# Patient Record
Sex: Male | Born: 1965 | Race: White | Hispanic: No | Marital: Single | Smoking: Current every day smoker
Health system: Southern US, Community
[De-identification: ages and names within clinical notes are randomized; demographics above are authoritative.]

## PROBLEM LIST (undated history)

## (undated) DIAGNOSIS — I10 Essential (primary) hypertension: Secondary | ICD-10-CM

## (undated) DIAGNOSIS — K259 Gastric ulcer, unspecified as acute or chronic, without hemorrhage or perforation: Secondary | ICD-10-CM

## (undated) DIAGNOSIS — F1010 Alcohol abuse, uncomplicated: Secondary | ICD-10-CM

## (undated) DIAGNOSIS — K219 Gastro-esophageal reflux disease without esophagitis: Secondary | ICD-10-CM

## (undated) DIAGNOSIS — K60329 Anal fistula, complex, unspecified: Secondary | ICD-10-CM

## (undated) DIAGNOSIS — J449 Chronic obstructive pulmonary disease, unspecified: Secondary | ICD-10-CM

## (undated) HISTORY — DX: Essential (primary) hypertension: I10

## (undated) HISTORY — DX: Anal fistula, complex, unspecified: K60.329

## (undated) HISTORY — DX: Alcohol abuse, uncomplicated: F10.10

## (undated) HISTORY — DX: Chronic obstructive pulmonary disease, unspecified: J44.9

## (undated) HISTORY — PX: FINGER SURGERY: SHX640

---

## 2016-09-03 ENCOUNTER — Ambulatory Visit: Payer: Self-pay | Admitting: Physician Assistant

## 2016-09-03 ENCOUNTER — Ambulatory Visit (HOSPITAL_COMMUNITY)
Admission: RE | Admit: 2016-09-03 | Discharge: 2016-09-03 | Disposition: A | Discharge status: Home / Self Care | Payer: Self-pay | Source: Ambulatory Visit | Attending: Physician Assistant | Admitting: Physician Assistant

## 2016-09-03 ENCOUNTER — Encounter: Payer: Self-pay | Admitting: Physician Assistant

## 2016-09-03 VITALS — BP 120/72 | HR 93 | Temp 98.1°F | Ht 71.5 in | Wt 141.2 lb

## 2016-09-03 DIAGNOSIS — R351 Nocturia: Secondary | ICD-10-CM

## 2016-09-03 DIAGNOSIS — Z1211 Encounter for screening for malignant neoplasm of colon: Secondary | ICD-10-CM

## 2016-09-03 DIAGNOSIS — Z139 Encounter for screening, unspecified: Secondary | ICD-10-CM

## 2016-09-03 DIAGNOSIS — Z131 Encounter for screening for diabetes mellitus: Secondary | ICD-10-CM

## 2016-09-03 DIAGNOSIS — M25551 Pain in right hip: Secondary | ICD-10-CM | POA: Insufficient documentation

## 2016-09-03 DIAGNOSIS — N529 Male erectile dysfunction, unspecified: Secondary | ICD-10-CM

## 2016-09-03 DIAGNOSIS — R202 Paresthesia of skin: Secondary | ICD-10-CM

## 2016-09-03 DIAGNOSIS — R634 Abnormal weight loss: Secondary | ICD-10-CM

## 2016-09-03 DIAGNOSIS — F17219 Nicotine dependence, cigarettes, with unspecified nicotine-induced disorders: Secondary | ICD-10-CM | POA: Insufficient documentation

## 2016-09-03 DIAGNOSIS — Z125 Encounter for screening for malignant neoplasm of prostate: Secondary | ICD-10-CM

## 2016-09-03 LAB — GLUCOSE, POCT (MANUAL RESULT ENTRY): POC GLUCOSE: 107 mg/dL — AB (ref 70–99)

## 2016-09-03 NOTE — Congregational Nurse Program (Signed)
Congregational Nurse Program Note  Date of Encounter: 09/03/2016  Past Medical History: No past medical history on file.  Encounter Details:     CNP Questionnaire - 09/03/16 1103      Patient Demographics   Is this a new or existing patient? New   Patient is considered a/an Not Applicable   Race Caucasian/White     Patient Assistance   Location of Patient Assistance [facility]   Patient's financial/insurance status Low Income;Self-Pay (Uninsured)   Uninsured Patient ([place]s) Yes   Interventions Counseled to make appt. with provider;Assisted patient in making appt.   Patient referred to apply for the following financial assistance [facility]   Food insecurities addressed Not Applicable   Transportation assistance No   Assistance securing medications No   Educational health offerings Acute disease;Chronic disease;Navigating the healthcare system;Hypertension;Medications     Encounter Details   Primary purpose of visit Acute Illness/Condition Visit;Chronic Illness/Condition Visit;Education/Health Concerns;Navigating the Healthcare System   Was an Emergency Department visit averted? Not Applicable  referred to [facility] with appointment today. May still be referred to ER   Does patient have a medical provider? No   Patient referred to Area Agency;Establish PCP;Clinic   Was a mental health screening completed? (GAINS tool) No   Does patient have dental issues? No   Does patient have vision issues? No   Does your patient have an abnormal blood pressure today? Yes   Since previous encounter, have you referred patient for abnormal blood pressure that resulted in a new diagnosis or medication change? No   Does your patient have an abnormal blood glucose today? No   Since previous encounter, have you referred patient for abnormal blood glucose that resulted in a new diagnosis or medication change? No   Was there a life-saving intervention made? No      New Client to Hyman Bower, sent here from the [facility] today for screening for a medical provider, since he was in town. 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 He has no medical insurance.   Chief complaint today is right hip pain that radiates to his right groin and into his right buttock and feels like a popping at times. He reports that about 3 weeks ago, he was stepping off a trailer onto his right leg and felt an immediate pop and pain in his right buttock and right hip and went to the ground in pain. He was finally able to get up on thinking it would get better. He did not seek any treatment at the hospital due to no insurance and financial reasons. He states that his pain is no better. He has pain with lying , sitting , standing and walking , but states it hurts worse when lying in bed. Upon RN entering the room client was sitting in a chair on his left hip with his right leg turned internally to relieve pain. Client is able to sit with hip flexed and legs straight. No further movement of the hip joint or leg due to unknown cause of pain. Client states he has been taking "goody powders" to the point of his stomach hurting along with tylenols and ibuprofen, but only the goody powders help the most. RN discussed with client the issues and potential problems with using goody powders frequently such as gastric issues, esophagus issues due to  the powders. Client states understanding. "I just want to be fixed". Client appears in pain, he is able to walk but with pain and decreased weight bearing on his right leg.  Client reports he has not seen a medical provider in over 10 years due to financial and insurance issues.  Client also reports that he has to get up 7 to 8 times a night with the urge to urinate , but then only urinates a small amount or none. 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 He has not had any labs checked. He also reports sexual dysfunction and states his girlfriend left him 8 years ago due to "things don't work" He has not had any other exams since. He also complains of numbness and  Throbbing pain in both lower extremities from the knees down and tingling.  client relates this has been going on prior to the hip injury. He states he cannot feel his feet at night and uses and heating blanket to keep them warm. Client denies any past history of back injury. He is a smoker. Due to pain with hip movement , client not asked to remove his work boots. ( client referred and has an appointment with medical provider today at 1:45 PM at the [facility])  Alert and oriented to person and place, answers questions appropriately. Appears to be in considerable pain. RN discussed options for medical care including Emergency room. Client is worried about incurring a hospital bill. He wishes for a referral to the [facility]. Will refer for an as soon as possible appointment. Appointment secured for today 09/03/16 at 1:45 PM which is better for client since Mother drove him and she came with elderly grandmother from [place] to pick him up in [place] today to get him medical treatment. They are willing to stay in town for his appointment. RN did explain that the patient could still be referred to the Emergency room for evaluation if the medical provider at the [facility] felt it necessary. RN explained there is financial assistance available to those who qualify that could possibly assist with those bills. Client understands and is willing to proceed. RN also discussed that if future follow appointments for [facility] with [facility] were needed and that he had no transportation to notify RN within at least 3 to 4 business days to arrange [facility]. Client given RN contact information. 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 RN also discussed with client asking if follow ups could possibly be done in [place] when the free clinic is there once a month. Client made aware that would be at the discretion of the provider. Client states verbal understanding.  Will follow up with client after his appointment today and as needed for any further needs.   Vital signs today: Blood pressure 168/79, pulse 105, Temp 98.1 orally  Random blood glucose 107. Resp 13.

## 2016-09-03 NOTE — Progress Notes (Signed)
BP 120/72 (BP Location: Left Arm, Patient Position: Sitting, Cuff Size: Normal)   Pulse 93   Temp 98.1 F (36.7 C)   Ht 5' 11.5" (1.816 m)   Wt 141 lb 4 oz (64.1 kg)   SpO2 99%   BMI 19.43 kg/m    Subjective:    Patient ID: Travis Weeks, male    DOB: January 02, 1966, 51 y.o.   MRN: [id number]  HPI: Travis Weeks is a 51 y.o. male presenting on 09/03/2016 for New Patient (Initial Visit) (pt has not had a PCP in many years.) and Hip Pain (for 3 weeks. pain radiating down to knee. pt has tried muscle rub and has not helped. pt also taking goody powers and IBU. pt states does not help)   HPI  Chief Complaint  Patient presents with  . New Patient (Initial Visit)    pt has not had a PCP in many years.  . Hip Pain    for 3 weeks. pain radiating down to knee. pt has tried muscle rub and has not helped. pt also taking goody powers and IBU. pt states does not help    Pt states when he stepped off a step his hip "just popped out".  Instant pain and it has been constant.  It is making it difficult for him to walk.  Pt states problems urinating for years. He gets up 6-8 times each night to go to the bathroom.   No dysuria.    Pt also complains of ED x 4 years  Pt also says that he has Lost 30 pounds over past few months  Pt states numbness and pain BLE from the knee down for past few years.    Relevant past medical, surgical, family and social history reviewed and updated as indicated. Interim medical history since our last visit reviewed. Allergies and medications reviewed and updated.   Current Outpatient Prescriptions:  .  Aspirin-Acetaminophen-Caffeine (GOODY HEADACHE PO), Take by mouth., Disp: , Rfl:  .  ibuprofen (ADVIL,MOTRIN) 200 MG tablet, Take 200 mg by mouth every 6 (six) hours as needed., Disp: , Rfl:    Review of Systems  Constitutional: Positive for appetite change and fatigue. Negative for chills, diaphoresis, fever and unexpected weight change.  HENT: Positive for dental  problem and trouble swallowing. Negative for congestion, drooling, ear pain, facial swelling, hearing loss, mouth sores, sneezing, sore throat and voice change.   Eyes: Negative for pain, discharge, redness, itching and visual disturbance.  Respiratory: Positive for choking. Negative for cough, shortness of breath and wheezing.   Cardiovascular: Negative for chest pain, palpitations and leg swelling.  Gastrointestinal: Negative for abdominal pain, blood in stool, constipation, diarrhea and vomiting.  Endocrine: Negative for cold intolerance, heat intolerance and polydipsia.  Genitourinary: Negative for decreased urine volume, dysuria and hematuria.  Musculoskeletal: Positive for arthralgias and gait problem. Negative for back pain.  Skin: Negative for rash.  Allergic/Immunologic: Negative for environmental allergies.  Neurological: Negative for seizures, syncope, light-headedness and headaches.  Hematological: Negative for adenopathy.  Psychiatric/Behavioral: Negative for agitation, dysphoric mood and suicidal ideas. The patient is not nervous/anxious.     Per HPI unless specifically indicated above     Objective:    BP 120/72 (BP Location: Left Arm, Patient Position: Sitting, Cuff Size: Normal)   Pulse 93   Temp 98.1 F (36.7 C)   Ht 5' 11.5" (1.816 m)   Wt 141 lb 4 oz (64.1 kg)   SpO2 99%   BMI 19.43 kg/m  Wt Readings from Last 3 Encounters:  09/03/16 141 lb 4 oz (64.1 kg)  09/03/16 139 lb 3.2 oz (63.1 kg)    Physical Exam  Constitutional: He is oriented to person, place, and time. He appears well-developed and well-nourished.  Pleasant gentleman who appears much older than stated age.  HENT:  Head: Normocephalic and atraumatic.  Mouth/Throat: Oropharynx is clear and moist. No oropharyngeal exudate.  Eyes: Conjunctivae and EOM are normal. Pupils are equal, round, and reactive to light.  Neck: Neck supple. No thyromegaly present.  Cardiovascular: Normal rate and regular  rhythm.   Pulses:      Dorsalis pedis pulses are 1+ on the right side, and 1+ on the left side.  Pulmonary/Chest: Effort normal and breath sounds normal. He has no wheezes. He has no rales.  Abdominal: Soft. Bowel sounds are normal. He exhibits no mass. There is no hepatosplenomegaly. There is no tenderness.  Musculoskeletal: He exhibits no edema.       Right hip: He exhibits decreased range of motion and tenderness. He exhibits no bony tenderness, no swelling and no deformity.       Lumbar back: He exhibits tenderness. He exhibits normal range of motion, no bony tenderness, no swelling, no edema and no deformity.       Back:  Lymphadenopathy:    He has no cervical adenopathy.  Neurological: He is alert and oriented to person, place, and time.  Reflex Scores:      Patellar reflexes are 1+ on the right side and 2+ on the left side. Skin: Skin is warm and dry. No rash noted.  Psychiatric: He has a normal mood and affect. His behavior is normal. Thought content normal.  Vitals reviewed.   Results for orders placed or performed in visit on 09/03/16  POCT glucose  Result Value Ref Range   POC Glucose 107 (A) 70 - 99 mg/dl      Assessment & Plan:   Encounter Diagnoses  Name Primary?  . Right hip pain Yes  . Weight loss   . Cigarette nicotine dependence with nicotine-induced disorder   . Nocturia   . Erectile dysfunction, unspecified erectile dysfunction type   . Paresthesias   . Screening for diabetes mellitus   . Screening for prostate cancer   . Screening for colon cancer     -will get baseline labs drawn today.  Pt will need lipids checked but no lipids today because he needs labs but can't get back to town for blood draw in a timely fashion -get Xray of the hip -pt is given ifobt for colon cancer screening -pt is given [facility] discount application -pt is given information on [facility] -follow up1 week.  Pt to RTO sooner prn worsening or new symptoms

## 2016-09-03 NOTE — Patient Instructions (Signed)
Get xray Get bloodwork Turn in [facility] discount application Bring poop test back with you to your follow up appointment

## 2016-09-04 ENCOUNTER — Observation Stay (HOSPITAL_COMMUNITY): Payer: Self-pay

## 2016-09-04 ENCOUNTER — Encounter (HOSPITAL_COMMUNITY): Payer: Self-pay | Admitting: Emergency Medicine

## 2016-09-04 ENCOUNTER — Observation Stay (HOSPITAL_COMMUNITY)
Admission: EM | Admit: 2016-09-04 | Discharge: 2016-09-05 | Disposition: A | Discharge status: Home / Self Care | Payer: Self-pay | Attending: Emergency Medicine | Admitting: Emergency Medicine

## 2016-09-04 ENCOUNTER — Telehealth: Payer: Self-pay

## 2016-09-04 DIAGNOSIS — F17219 Nicotine dependence, cigarettes, with unspecified nicotine-induced disorders: Secondary | ICD-10-CM | POA: Diagnosis present

## 2016-09-04 DIAGNOSIS — F17210 Nicotine dependence, cigarettes, uncomplicated: Secondary | ICD-10-CM | POA: Insufficient documentation

## 2016-09-04 DIAGNOSIS — Z7982 Long term (current) use of aspirin: Secondary | ICD-10-CM | POA: Insufficient documentation

## 2016-09-04 DIAGNOSIS — M25551 Pain in right hip: Secondary | ICD-10-CM | POA: Diagnosis present

## 2016-09-04 DIAGNOSIS — M25559 Pain in unspecified hip: Secondary | ICD-10-CM

## 2016-09-04 DIAGNOSIS — F1010 Alcohol abuse, uncomplicated: Secondary | ICD-10-CM | POA: Diagnosis present

## 2016-09-04 DIAGNOSIS — E871 Hypo-osmolality and hyponatremia: Principal | ICD-10-CM | POA: Diagnosis present

## 2016-09-04 LAB — COMPREHENSIVE METABOLIC PANEL
ALK PHOS: 75 U/L (ref 40–115)
ALK PHOS: 79 U/L (ref 38–126)
ALT: 17 U/L (ref 9–46)
ALT: 32 U/L (ref 17–63)
AST: 27 U/L (ref 10–35)
AST: 48 U/L — AB (ref 15–41)
Albumin: 3.4 g/dL — ABNORMAL LOW (ref 3.6–5.1)
Albumin: 3.6 g/dL (ref 3.5–5.0)
Anion gap: 8 (ref 5–15)
BILIRUBIN TOTAL: 0.7 mg/dL (ref 0.2–1.2)
BUN: 7 mg/dL (ref 7–25)
BUN: 6 mg/dL (ref 6–20)
CALCIUM: 9 mg/dL (ref 8.9–10.3)
CHLORIDE: 88 mmol/L — AB (ref 101–111)
CO2: 25 mmol/L (ref 20–31)
CO2: 26 mmol/L (ref 22–32)
CREATININE: 0.67 mg/dL — AB (ref 0.70–1.33)
CREATININE: 0.64 mg/dL (ref 0.61–1.24)
Calcium: 8.9 mg/dL (ref 8.6–10.3)
Chloride: 88 mmol/L — ABNORMAL LOW (ref 98–110)
GLUCOSE: 73 mg/dL (ref 65–99)
Glucose, Bld: 88 mg/dL (ref 65–99)
Potassium: 4.9 mmol/L (ref 3.5–5.3)
Potassium: 4.5 mmol/L (ref 3.5–5.1)
Sodium: 121 mmol/L — ABNORMAL LOW (ref 135–146)
Sodium: 122 mmol/L — ABNORMAL LOW (ref 135–145)
TOTAL PROTEIN: 6.2 g/dL (ref 6.1–8.1)
Total Bilirubin: 0.3 mg/dL (ref 0.3–1.2)
Total Protein: 7 g/dL (ref 6.5–8.1)

## 2016-09-04 LAB — RAPID URINE DRUG SCREEN, HOSP PERFORMED
Amphetamines: NOT DETECTED
BENZODIAZEPINES: POSITIVE — AB
Barbiturates: NOT DETECTED
Cocaine: POSITIVE — AB
Opiates: NOT DETECTED
Tetrahydrocannabinol: NOT DETECTED

## 2016-09-04 LAB — CBC WITH DIFFERENTIAL/PLATELET
BASOS ABS: 0 {cells}/uL (ref 0–200)
BASOS PCT: 0 %
Basophils Absolute: 0 10*3/uL (ref 0.0–0.1)
Basophils Relative: 0 %
EOS ABS: 186 {cells}/uL (ref 15–500)
EOS ABS: 0.2 10*3/uL (ref 0.0–0.7)
EOS PCT: 2 %
Eosinophils Relative: 2 %
HCT: 43.2 % (ref 39.0–52.0)
HEMATOCRIT: 42.1 % (ref 38.5–50.0)
Hemoglobin: 14.2 g/dL (ref 13.2–17.1)
Hemoglobin: 15.6 g/dL (ref 13.0–17.0)
LYMPHS ABS: 2.3 10*3/uL (ref 0.7–4.0)
LYMPHS PCT: 25 %
Lymphocytes Relative: 26 %
Lymphs Abs: 2325 cells/uL (ref 850–3900)
MCH: 32.9 pg (ref 27.0–33.0)
MCH: 33.9 pg (ref 26.0–34.0)
MCHC: 33.7 g/dL (ref 32.0–36.0)
MCHC: 36.1 g/dL — AB (ref 30.0–36.0)
MCV: 97.7 fL (ref 80.0–100.0)
MCV: 93.9 fL (ref 78.0–100.0)
MONO ABS: 651 {cells}/uL (ref 200–950)
MPV: 10 fL (ref 7.5–12.5)
Monocytes Absolute: 0.7 10*3/uL (ref 0.1–1.0)
Monocytes Relative: 7 %
Monocytes Relative: 8 %
NEUTROS PCT: 66 %
Neutro Abs: 6138 cells/uL (ref 1500–7800)
Neutro Abs: 5.6 10*3/uL (ref 1.7–7.7)
Neutrophils Relative %: 64 %
PLATELETS: 275 10*3/uL (ref 150–400)
Platelets: 296 10*3/uL (ref 140–400)
RBC: 4.31 MIL/uL (ref 4.20–5.80)
RBC: 4.6 MIL/uL (ref 4.22–5.81)
RDW: 12.8 % (ref 11.0–15.0)
RDW: 11.8 % (ref 11.5–15.5)
WBC: 9.3 10*3/uL (ref 3.8–10.8)
WBC: 8.8 10*3/uL (ref 4.0–10.5)

## 2016-09-04 LAB — BASIC METABOLIC PANEL
Anion gap: 7 (ref 5–15)
BUN: 5 mg/dL — AB (ref 6–20)
CALCIUM: 8.4 mg/dL — AB (ref 8.9–10.3)
CO2: 24 mmol/L (ref 22–32)
Chloride: 92 mmol/L — ABNORMAL LOW (ref 101–111)
Creatinine, Ser: 0.53 mg/dL — ABNORMAL LOW (ref 0.61–1.24)
GFR calc Af Amer: >60 mL/min (ref >60)
Glucose, Bld: 98 mg/dL (ref 65–99)
POTASSIUM: 4.1 mmol/L (ref 3.5–5.1)
Sodium: 123 mmol/L — ABNORMAL LOW (ref 135–145)

## 2016-09-04 LAB — PSA: PSA: 1.2 ng/mL (ref <=4.0)

## 2016-09-04 LAB — URINALYSIS, ROUTINE W REFLEX MICROSCOPIC
BILIRUBIN URINE: NEGATIVE
GLUCOSE, UA: NEGATIVE mg/dL
HGB URINE DIPSTICK: NEGATIVE
Ketones, ur: NEGATIVE mg/dL
Leukocytes, UA: NEGATIVE
Nitrite: NEGATIVE
PH: 5 (ref 5.0–8.0)
Protein, ur: NEGATIVE mg/dL
Specific Gravity, Urine: 1.004 — ABNORMAL LOW (ref 1.005–1.030)

## 2016-09-04 LAB — TSH
TSH: 1.76 mIU/L (ref 0.40–4.50)
TSH: 1.795 u[IU]/mL (ref 0.350–4.500)

## 2016-09-04 LAB — OSMOLALITY: OSMOLALITY: 270 mosm/kg — AB (ref 275–295)

## 2016-09-04 LAB — HEMOGLOBIN A1C
Hgb A1c MFr Bld: 4.2 % (ref <5.7)
Mean Plasma Glucose: 74 mg/dL

## 2016-09-04 LAB — SODIUM, URINE, RANDOM: Sodium, Ur: <10 mmol/L

## 2016-09-04 MED ORDER — ONDANSETRON HCL 4 MG PO TABS: 4.0000 mg | ORAL_TABLET | Freq: Four times a day (QID) | ORAL | Status: DC | PRN

## 2016-09-04 MED ORDER — NICOTINE 21 MG/24HR TD PT24: 21.0000 mg | MEDICATED_PATCH | Freq: Every day | TRANSDERMAL | Status: DC

## 2016-09-04 MED ORDER — ONDANSETRON HCL 4 MG/2ML IJ SOLN: 4.0000 mg | Freq: Four times a day (QID) | INTRAMUSCULAR | Status: DC | PRN

## 2016-09-04 MED ORDER — MORPHINE SULFATE (PF) 2 MG/ML IV SOLN
2.0000 mg | INTRAVENOUS | Status: DC | PRN
  Administered 2016-09-04 – 2016-09-05 (×4): 2 mg via INTRAVENOUS
  Filled 2016-09-04 (×4): qty 1

## 2016-09-04 MED ORDER — ENOXAPARIN SODIUM 40 MG/0.4ML ~~LOC~~ SOLN: 40.0000 mg | SUBCUTANEOUS | Status: DC

## 2016-09-04 MED ORDER — ACETAMINOPHEN 650 MG RE SUPP: 650.0000 mg | Freq: Four times a day (QID) | RECTAL | Status: DC | PRN

## 2016-09-04 MED ORDER — ACETAMINOPHEN 325 MG PO TABS: 650.0000 mg | ORAL_TABLET | Freq: Four times a day (QID) | ORAL | Status: DC | PRN

## 2016-09-04 NOTE — ED Provider Notes (Signed)
[facility]-EMERGENCY DEPT Provider Note   CSN: [id number] Arrival date & time: 09/04/16  1458     History   Chief Complaint Chief Complaint  Patient presents with  . Abnormal Lab    HPI Travis Weeks is a 51 y.o. male.  The history is provided by the patient.  51 year old male who presents with abnormal lab. States that he had first routine PCP appointment yesterday. Routine blood work is performed. He was notified today that his sodium was low, and was directed to the emergency department for management. States that he has had generalized weakness and fatigue that has been ongoing for 3-4 weeks. States that he may have had decreased appetite and decreased by mouth intake in the setting of intermittent upper abdominal pain. States that he thinks he has an ulcer can see takes a lot of Goody powders for right hip pain that he has been having. No fevers, chills, nausea or vomiting, diarrhea, melena or hematochezia, lower extremity edema, shortness of breath, altered mental status. Does drink alcohol about 3-4 cans of beer daily.  History reviewed. No pertinent past medical history.  Patient Active Problem List   Diagnosis Date Noted  . Cigarette nicotine dependence with nicotine-induced disorder 09/03/2016  . Right hip pain 09/03/2016  . Weight loss 09/03/2016  . Nocturia 09/03/2016    Past Surgical History:  Procedure Laterality Date  . FINGER SURGERY Left    middle finger of L hand. pt unable to move it       Home Medications    Prior to Admission medications   Medication Sig Start Date End Date Taking? Authorizing Provider  Aspirin-Acetaminophen-Caffeine (GOODY HEADACHE PO) Take by mouth.   Yes Historical Provider, MD  ibuprofen (ADVIL,MOTRIN) 200 MG tablet Take 200 mg by mouth every 6 (six) hours as needed.   Yes Historical Provider, MD    Family History Family History  Problem Relation Age of Onset  . Hypertension Mother   . Diabetes Mother     Social History Social  History  Substance Use Topics  . Smoking status: Current Every Day Smoker    Packs/day: 1.00    Years: 39.00    Types: Cigarettes  . Smokeless tobacco: Never Used  . Alcohol use 1.2 oz/week    2 Cans of beer per week     Allergies   Patient has no known allergies.   Review of Systems Review of Systems  Constitutional: Negative for fever.  Respiratory: Negative for cough and shortness of breath.   Cardiovascular: Negative for chest pain.  Gastrointestinal: Positive for abdominal pain. Negative for blood in stool.  Genitourinary: Positive for frequency. Negative for difficulty urinating.  Allergic/Immunologic: Negative for immunocompromised state.  Neurological: Negative for syncope.  Psychiatric/Behavioral: Negative for confusion.  All other systems reviewed and are negative.    Physical Exam Updated Vital Signs BP 119/83   Pulse 88   Temp 98 F (36.7 C) (Oral)   Resp 18   Ht  (1.803 m)   Wt 141 lb (64 kg)   SpO2 100%   BMI 19.67 kg/m   Physical Exam Physical Exam  Nursing note and vitals reviewed. Constitutional: Malnourished appearing, non-toxic, and in no acute distress Head: Normocephalic and atraumatic.  Mouth/Throat: Oropharynx is clear and moist.  Neck: Normal range of motion. Neck supple.  Cardiovascular: Normal rate and regular rhythm.   Pulmonary/Chest: Effort normal and breath sounds normal.  Abdominal: Soft. There is no tenderness. There is no rebound and no guarding.  Musculoskeletal: Normal range of motion. no edema. Neurological: Alert, no facial droop, fluent speech, moves all extremities symmetrically Skin: Skin is warm and dry.  Psychiatric: Cooperative   ED Treatments / Results  Labs (all labs ordered are listed, but only abnormal results are displayed) Labs Reviewed  COMPREHENSIVE METABOLIC PANEL - Abnormal; Notable for the following:       Result Value   Sodium 122 (*)    Chloride 88 (*)    AST 48 (*)    All other  components within normal limits  CBC WITH DIFFERENTIAL/PLATELET - Abnormal; Notable for the following:    MCHC 36.1 (*)    All other components within normal limits  OSMOLALITY  TSH  SODIUM, URINE, RANDOM  OSMOLALITY, URINE  URINALYSIS, ROUTINE W REFLEX MICROSCOPIC    EKG  EKG Interpretation None       Radiology Dg Hip Unilat With Pelvis 2-3 Views Right  Result Date: 09/03/2016 CLINICAL DATA:  Constant right hip pain since climbing off a trailer 3-4 weeks ago. EXAM: DG HIP (WITH OR WITHOUT PELVIS) 2-3V RIGHT COMPARISON:  None. FINDINGS: No fracture.  No bone lesion. The hip joints are normally spaced and aligned. No significant arthropathic change. The SI joints and symphysis pubis are normally spaced and aligned. There are scattered ileo femoral vascular calcifications. The soft tissues are otherwise unremarkable. IMPRESSION: 1. No fracture or significant hip joint abnormality. Consider follow-up right hip MRI or MR arthrogram to assess for internal derangement, such as a labral tear. Electronically Signed   By: Amie Portland M.D.   On: 09/03/2016 15:25    Procedures Procedures (including critical care time) CRITICAL CARE Performed by: Lavera Guise   Total critical care time: 31 minutes  Critical care time was exclusive of separately billable procedures and treating other patients.  Critical care was necessary to treat or prevent imminent or life-threatening deterioration.  Critical care was time spent personally by me on the following activities: development of treatment plan with patient and/or surrogate as well as nursing, examination of patient, obtaining history from patient or surrogate, ordering and performing treatments and interventions, ordering and review of laboratory studies, ordering and review of radiographic studies, pulse oximetry and re-evaluation of patient's condition.  Medications Ordered in ED Medications - No data to display   Initial Impression /  Assessment and Plan / ED Course  I have reviewed the triage vital signs and the nursing notes.  Pertinent labs & imaging results that were available during my care of the patient were reviewed by me and considered in my medical decision making (see chart for details).     With hyponatremia 122 today. 121 yesterday. Mentating normally. Other than generalized weakness, no significant symptoms. Appears euvolemic on exam, but malnourished appearing and does endorse recent history of decreased PO intake and can be hypovolemic hyponatremia. Normal renal function. Does drink daily, but only 3-4 drinks by report. Beer potomania is possible.  Urine sodium and osmol sent. Discussed with Dr. Ophelia Charter. Will plan to admit for work-up  Final Clinical Impressions(s) / ED Diagnoses   Final diagnoses:  Hyponatremia    New Prescriptions New Prescriptions   No medications on file     Lavera Guise, MD 09/04/16 1851

## 2016-09-04 NOTE — Telephone Encounter (Signed)
Called client to follow up post PCP visit on 09/03/16 at the [facility]. Client states all went well, he had an x-ray of his right hip and some blood work drawn and is to go back on 09/11/16. RN inquired if client needed [facility] for transportation and he states his mother is trying to arrange to bring him, however, he will let me know Monday Morning early regarding transportation . Will continue to follow as needed.

## 2016-09-04 NOTE — H&P (Signed)
History and Physical    Travis Weeks ZOX:[id number] DOB: [birth year] DOA: 09/04/2016  PCP: Jacquelin Hawking, PA-C- [facility] the [facility], prior doctor was 15 years ago  Patient coming from: home - lives alone; [place]: mother, Britta Mccreedy, [phone]  Chief Complaint: abnormal labs  HPI: Travis Weeks is a 51 y.o. male with no significant past medical history who was sent to the ER following abnormal lab results at his first physician appointment in 15 years.  He went to the doctor about his right hip, which he hurt when he stepped off a curb wrong and felt a snapping and popping.  Hip feels like it is popped out of joint x 4 weeks.  It hurts terribly.  He has been taking large quantities of Goody powders for this and thinks he may have an ulcer due to burning epigastric pain.  He has no feeling in feet/legs - when PA saw this, she ordered blood tests and he was found to have "low sodium, WBC abnormal, sugar low, high cholesterol...".  Today, he was at home and PA called him and told him to come to the ER right now.  Wakes up 10 times a night going to pee.  Erectile Dysfunction - lost girlfriend 4 years ago because of this.  No chest pain.  No problems with breathing.  +weak/tired x 4 weeks.  Only dizzy when "sugar drops like crazy".  No medication for this problem - "I just swallow a candy bar and a soda and hope for the best."  Needs 2 teeth pulled for the last 6 months, lots of infections.  Weight loss ?30 pounds over several months.  ED Course: Na++ 121 yesterday, 122 today.  Mentating normally.  Euvolemic but malnourished - so possibly hypovolemic hyponatremia.  Beer potomania is also possible.  Urine sodium and osmol sent.  Review of Systems: As per HPI; otherwise review of systems reviewed and negative.   Ambulatory Status:  ambulates without assistance  History reviewed. No pertinent past medical history.  Past Surgical History:  Procedure Laterality Date  . FINGER  SURGERY Left    middle finger of L hand. pt unable to move it    Social History   Social History  . Marital status: Unknown    Spouse name: N/A  . Number of children: N/A  . Years of education: N/A   Occupational History  . metal worker    Social History Main Topics  . Smoking status: Current Every Day Smoker    Packs/day: 1.00    Years: 39.00    Types: Cigarettes  . Smokeless tobacco: Never Used  . Alcohol use 1.2 - 1.8 oz/week    2 - 3 Cans of beer per week  . Drug use: No  . Sexual activity: Not Currently   Other Topics Concern  . Not on file   Social History Narrative  . No narrative on file    No Known Allergies  Family History  Problem Relation Age of Onset  . Hypertension Mother   . Diabetes Mother     Prior to Admission medications   Medication Sig Start Date End Date Taking? Authorizing Provider  Aspirin-Acetaminophen-Caffeine (GOODY HEADACHE PO) Take by mouth.   Yes Historical Provider, MD  ibuprofen (ADVIL,MOTRIN) 200 MG tablet Take 200 mg by mouth every 6 (six) hours as needed.   Yes Historical Provider, MD    Physical Exam: Vitals:   09/04/16 1504 09/04/16 1741 09/04/16 1935  BP: 140/86 119/83 Marland Kitchen)  144/105  Pulse: (!) 113 88 84  Resp: Temp: 98 F (36.7 C)    TempSrc: Oral    SpO2: 100% 100% 99%  Weight: 64 kg (141 lb)    Height:  (1.803 m)       General: Appears calm and comfortable and is NAD; cachectic, appears far older than stated age Eyes:  PERRL, EOMI, normal lids, iris ENT:  grossly normal hearing, lips & tongue, mmm Neck:  no LAD, masses or thyromegaly Cardiovascular:  RRR, no m/r/g. No LE edema.  Respiratory:  CTA bilaterally, no w/r/r. Normal respiratory effort. Abdomen:  soft, ntnd, NABS Skin:  no rash or induration seen on limited exam Musculoskeletal: severe TTP of right hip along flexor insertion with radiation into right gluteal region Psychiatric:  grossly normal mood and affect, speech fluent and  appropriate, AOx3 Neurologic:  CN 2-12 grossly intact, moves all extremities in coordinated fashion other than limitations of pain in right hip  Labs on Admission: I have personally reviewed following labs and imaging studies  CBC:  Recent Labs Lab 09/03/16 1531 09/04/16 1513  WBC 9.3 8.8  NEUTROABS 6,138 5.6  HGB 14.2 15.6  HCT 42.1 43.2  MCV 97.7 93.9  PLT 296 275   Basic Metabolic Panel:  Recent Labs Lab 09/03/16 1531 09/04/16 1513  NA 121* 122*  K 4.9 4.5  CL 88* 88*  CO2 25 26  GLUCOSE 73 88  BUN 7 6  CREATININE 0.67* 0.64  CALCIUM 8.9 9.0   GFR: Estimated Creatinine Clearance: 98.9 mL/min (by C-G formula based on SCr of 0.64 mg/dL). Liver Function Tests:  Recent Labs Lab 09/03/16 1531 09/04/16 1513  AST 27 48*  ALT 17 32  ALKPHOS 75 79  BILITOT 0.7 0.3  PROT 6.2 7.0  ALBUMIN 3.4* 3.6   No results for input(s): LIPASE, AMYLASE in the last 168 hours. No results for input(s): AMMONIA in the last 168 hours. Coagulation Profile: No results for input(s): INR, PROTIME in the last 168 hours. Cardiac Enzymes: No results for input(s): CKTOTAL, CKMB, CKMBINDEX, TROPONINI in the last 168 hours. BNP (last 3 results) No results for input(s): PROBNP in the last 8760 hours. HbA1C:  Recent Labs  09/03/16 1531  HGBA1C 4.2   CBG: No results for input(s): GLUCAP in the last 168 hours. Lipid Profile: No results for input(s): CHOL, HDL, LDLCALC, TRIG, CHOLHDL, LDLDIRECT in the last 72 hours. Thyroid Function Tests:  Recent Labs  09/04/16 1513  TSH 1.795   Anemia Panel: No results for input(s): VITAMINB12, FOLATE, FERRITIN, TIBC, IRON, RETICCTPCT in the last 72 hours. Urine analysis: No results found for: COLORURINE, APPEARANCEUR, LABSPEC, PHURINE, GLUCOSEU, HGBUR, BILIRUBINUR, KETONESUR, PROTEINUR, UROBILINOGEN, NITRITE, LEUKOCYTESUR  Creatinine Clearance: Estimated Creatinine Clearance: 98.9 mL/min (by C-G formula based on SCr of 0.64 mg/dL).  Sepsis  Labs: (procalcitonin:4,lacticidven:4) )No results found for this or any previous visit (from the past 240 hour(s)).   Radiological Exams on Admission: Dg Hip Unilat With Pelvis 2-3 Views Right  Result Date: 09/03/2016 CLINICAL DATA:  Constant right hip pain since climbing off a trailer 3-4 weeks ago. EXAM: DG HIP (WITH OR WITHOUT PELVIS) 2-3V RIGHT COMPARISON:  None. FINDINGS: No fracture.  No bone lesion. The hip joints are normally spaced and aligned. No significant arthropathic change. The SI joints and symphysis pubis are normally spaced and aligned. There are scattered ileo femoral vascular calcifications. The soft tissues are otherwise unremarkable. IMPRESSION: 1. No fracture or significant hip joint abnormality.  Consider follow-up right hip MRI or MR arthrogram to assess for internal derangement, such as a labral tear. Electronically Signed   By: Amie Portland M.D.   On: 09/03/2016 15:25    EKG: not done  Assessment/Plan Principal Problem:   Hyponatremia Active Problems:   Cigarette nicotine dependence with nicotine-induced disorder   Right hip pain   Pertinent labs:  Na 121 yesterday, 122 today (no priors) A1c 4.2 yesterday TSH normal yesterday and today PSA normal yesterday  Hyponatremia -Etiology appears to be euvolemic hyponatremia; there is no physical examination evidence of hypovolemia or hypervolemia at this time -His lack of mental status changes or more serious symptoms indicates that this is likely chronic in nature. -SIADH is a consideration and may indicate need for pulmonary CT or evaluation for CNS disorders -He denies taking medications other than Motrin and Goody powders, but NSAIDs can cause hyponatremia by an uncertain mechanism of action -Will check UDS -Beer potomania is also a serious consideration based on low intake of solutes and electrolytes with relatively high fluid intake. -Will check urinary and serum Osm as well as urinary sodium and  potassium levels -Will treat with fluid restriction of 1L day -This should be adjusted based on UOP with goal fluid intake of 500 cc less per day than his total urinary output -Will hold NSAIDs and ETOH -Will monitor sodium q8h to attempt to avoid overcorrection (>65mEq/L/day) so as to avoid central pontine myelinolysis -If fluid restriction is unsuccessful, would need to consider vaptan therapy   R hip pain -X-ray was negative -This was the primary reason that the patient presented to the doctor -He reports excruciating pain and really wants to know why this is hurting so -Will order MRI  Tobacco dependence -Encourage cessation.  This was discussed with the patient and should be reviewed on an ongoing basis.   Patch ordered.  Alcohol abuse -Patient does acknowledge daily drinking, 2-4 beers -He denies h/o withdrawal symptoms including DTs/seizures -He does not believe he has a problem with alcohol and thinks that he could stop drinking at any time -Will not currently place on CIWA but if there are any concerns will need to add this  Other From a health maintenance standpoint, the patient needs: -Colon cancer screening - given stool cards -Will check fasting lipids in AM -Likely needs TDaP -Lung cancer screening at age 34    DVT prophylaxis: Lovenox Code Status:  DNR - confirmed with patient Family Communication: None present Disposition Plan:  Home once clinically improved Consults called: None  Admission status: It is my clinical opinion that referral for OBSERVATION is reasonable and necessary in this patient based on the above information provided. The aforementioned taken together are felt to place the patient at high risk for further clinical deterioration. However it is anticipated that the patient may be medically stable for discharge from the hospital within 24 to 48 hours.    Jonah Blue MD [facility]  If 7PM-7AM, please contact  night-coverage [email or web address] Password [facility]  09/04/2016, 7:47 PM

## 2016-09-04 NOTE — ED Triage Notes (Signed)
Pt reports going to the doctor for the first time yesterday and was called and told to come to ED for hyponatremia.

## 2016-09-05 DIAGNOSIS — F1010 Alcohol abuse, uncomplicated: Secondary | ICD-10-CM

## 2016-09-05 DIAGNOSIS — E871 Hypo-osmolality and hyponatremia: Secondary | ICD-10-CM

## 2016-09-05 DIAGNOSIS — F17219 Nicotine dependence, cigarettes, with unspecified nicotine-induced disorders: Secondary | ICD-10-CM

## 2016-09-05 DIAGNOSIS — M25551 Pain in right hip: Secondary | ICD-10-CM

## 2016-09-05 LAB — BASIC METABOLIC PANEL
ANION GAP: 7 (ref 5–15)
ANION GAP: 6 (ref 5–15)
BUN: 7 mg/dL (ref 6–20)
BUN: 8 mg/dL (ref 6–20)
CHLORIDE: 96 mmol/L — AB (ref 101–111)
CO2: 26 mmol/L (ref 22–32)
CO2: 27 mmol/L (ref 22–32)
Calcium: 9 mg/dL (ref 8.9–10.3)
Calcium: 8.9 mg/dL (ref 8.9–10.3)
Chloride: 95 mmol/L — ABNORMAL LOW (ref 101–111)
Creatinine, Ser: 0.66 mg/dL (ref 0.61–1.24)
Creatinine, Ser: 0.67 mg/dL (ref 0.61–1.24)
GFR calc Af Amer: >60 mL/min (ref >60)
GLUCOSE: 88 mg/dL (ref 65–99)
Glucose, Bld: 93 mg/dL (ref 65–99)
POTASSIUM: 4.1 mmol/L (ref 3.5–5.1)
POTASSIUM: 5 mmol/L (ref 3.5–5.1)
SODIUM: 129 mmol/L — AB (ref 135–145)
Sodium: 128 mmol/L — ABNORMAL LOW (ref 135–145)

## 2016-09-05 LAB — LIPID PANEL
Cholesterol: 121 mg/dL (ref 0–200)
HDL: 67 mg/dL (ref >40)
LDL CALC: 42 mg/dL (ref 0–99)
Total CHOL/HDL Ratio: 1.8 RATIO
Triglycerides: 59 mg/dL (ref <150)
VLDL: 12 mg/dL (ref 0–40)

## 2016-09-05 LAB — CBC
HEMATOCRIT: 44.6 % (ref 39.0–52.0)
HEMOGLOBIN: 15.9 g/dL (ref 13.0–17.0)
MCH: 34 pg (ref 26.0–34.0)
MCHC: 35.7 g/dL (ref 30.0–36.0)
MCV: 95.3 fL (ref 78.0–100.0)
Platelets: 270 10*3/uL (ref 150–400)
RBC: 4.68 MIL/uL (ref 4.22–5.81)
RDW: 12.1 % (ref 11.5–15.5)
WBC: 8.2 10*3/uL (ref 4.0–10.5)

## 2016-09-05 LAB — OSMOLALITY, URINE: Osmolality, Ur: 139 mOsm/kg — ABNORMAL LOW (ref 300–900)

## 2016-09-05 MED ORDER — NICOTINE 21 MG/24HR TD PT24: 21.0000 mg | MEDICATED_PATCH | Freq: Every day | TRANSDERMAL | 0 refills | Status: DC

## 2016-09-05 MED ORDER — FAMOTIDINE 20 MG PO TABS: 20.0000 mg | ORAL_TABLET | Freq: Two times a day (BID) | ORAL | Status: DC

## 2016-09-05 MED ORDER — HYDROCODONE-ACETAMINOPHEN 5-325 MG PO TABS: 1.0000 | ORAL_TABLET | Freq: Four times a day (QID) | ORAL | 0 refills | Status: DC | PRN

## 2016-09-05 NOTE — Care Management Note (Signed)
Case Management Note  Patient Details  Name: Travis Weeks MRN: [id number] Date of Birth: Oct 31, 1965  Subjective/Objective:                  Pt admitted with hip pain. He is from home, ind with ADL's. He suffers with chronic hip pain. Pt goes to the [facility], has no insurance but no difficulty affording medications. He has transportation to appointments. He has appointment scheduled at [facility] next week. No needs communicated.   Action/Plan: Pt discharging home today with self care.   Expected Discharge Date:  09/05/16               Expected Discharge Plan:  Home/Self Care  In-House Referral:  NA  Discharge planning Services  CM Consult  Post Acute Care Choice:  NA Choice offered to:  NA  Status of Service:  Completed, signed off  Malcolm Metro, RN 09/05/2016, 12:50 PM

## 2016-09-05 NOTE — Discharge Summary (Addendum)
Travis Weeks, is a 51 y.o. male  DOB Nov 19, 1965  MRN [id number].  Admission date:  09/04/2016  Admitting Physician  Jonah Blue, MD  Discharge Date:  09/05/2016   Primary MD  Jacquelin Hawking, PA-C  Recommendations for primary care physician for things to follow:  - Please check CBC, BMP during next visit - Patient to follow with orthopedic as an outpatient   Admission Diagnosis  Hyponatremia [E87.1] Hip pain [M25.559]   Discharge Diagnosis  Hyponatremia [E87.1] Hip pain [M25.559]    Principal Problem:   Hyponatremia Active Problems:   Cigarette nicotine dependence with nicotine-induced disorder   Right hip pain   Alcohol abuse      History reviewed. No pertinent past medical history.  Past Surgical History:  Procedure Laterality Date  . FINGER SURGERY Left    middle finger of L hand. pt unable to move it       History of present illness and  Hospital Course:     Kindly see H&P for history of present illness and admission details, please review complete Labs, Consult reports and Test reports for all details in brief  HPI  from the history and physical done on the day of admission 4/26  HPI: Travis Weeks is a 51 y.o. male with no significant past medical history who was sent to the ER following abnormal lab results at his first physician appointment in 15 years.  He went to the doctor about his right hip, which he hurt when he stepped off a curb wrong and felt a snapping and popping.  Hip feels like it is popped out of joint x 4 weeks.  It hurts terribly.  He has been taking large quantities of Goody powders for this and thinks he may have an ulcer due to burning epigastric pain.  He has no feeling in feet/legs - when PA saw this, she ordered blood tests and he was found to have "low sodium, WBC abnormal, sugar low, high cholesterol...".  Today, he was at home and PA called him and told him  to come to the ER right now.  Wakes up 10 times a night going to pee.  Erectile Dysfunction - lost girlfriend 4 years ago because of this.  No chest pain.  No problems with breathing.  +weak/tired x 4 weeks.  Only dizzy when "sugar drops like crazy".  No medication for this problem - "I just swallow a candy bar and a soda and hope for the best."  Needs 2 teeth pulled for the last 6 months, lots of infections.  Weight loss ?30 pounds over several months.  ED Course: Na++ 121 yesterday, 122 today.  Mentating normally.  Euvolemic but malnourished - so possibly hypovolemic hyponatremia.  Beer potomania is also possible.  Urine sodium and osmol sent.   Hospital Course   Hyponatremia - This is most likely related to beer Potomania , she endorses 2-4 cans appear every night, he was kept on fluid restriction overnight, his sodium was monitored closely, went from 122 06/13/2027  overnight.  Right hip pain - Report this is a problem over the last 3 weeks, MRI significant for right greater trochanteric bursitis, and  Small amount of fluid undermines of the superior acetabular labrum of the right hip suspicious for a labral tear. - Chest with dorsal, recommendation for outpatient follow-up, and crutches(patient reportedly has at home) - Patient was instructed to avoid excessive NSAIDs use, he was given Vicodin 15 tablets on discharge, as well he be started on famotidine empirically.  Polysubstance abuse - patient tested positive for benzodiazepines and cocaine, he was counseled - Patient with known alcohol abuse, she was counseled as well, no evidence of withdrawals - As well he was counseled for tobacco abuse  Discharge Condition:  stable   Follow UP  Follow-up Information    Jacquelin Hawking, PA-C Follow up.   Specialty:  Physician Assistant Why:  keep your appointment on 5/3 Contact information: [address] [place] [address] [phone]        Fuller Canada, MD Follow up  in 1 week(s).   Specialties:  Orthopedic Surgery, Radiology Contact information: [address] [place] [address] [phone]             Discharge Instructions  and  Discharge Medications    Discharge Instructions    Discharge instructions    Complete by:  As directed    Follow with Primary MD Jacquelin Hawking, PA-C in 7 days   Get CBC, CMP,  checked  by Primary MD next visit.    Activity: As tolerated with Full fall precautions use walker/cane & assistance as needed   Disposition Home    Diet: Regular diet   On your next visit with your primary care physician please Get Medicines reviewed and adjusted.   Please request your Prim.MD to go over all Hospital Tests and Procedure/Radiological results at the follow up, please get all Hospital records sent to your Prim MD by signing hospital release before you go home.   If you experience worsening of your admission symptoms, develop shortness of breath, life threatening emergency, suicidal or homicidal thoughts you must seek medical attention immediately by calling 911 or calling your MD immediately  if symptoms less severe.  You Must read complete instructions/literature along with all the possible adverse reactions/side effects for all the Medicines you take and that have been prescribed to you. Take any new Medicines after you have completely understood and accpet all the possible adverse reactions/side effects.   Do not drive, operating heavy machinery, perform activities at heights, swimming or participation in water activities or provide baby sitting services if your were admitted for syncope or siezures until you have seen by Primary MD or a Neurologist and advised to do so again.  Do not drive when taking Pain medications.    Do not take more than prescribed Pain, Sleep and Anxiety Medications  Special Instructions: If you have smoked or chewed Tobacco  in the last 2 yrs please stop smoking, stop any  regular Alcohol  and or any Recreational drug use.  Wear Seat belts while driving.   Please note  You were cared for by a hospitalist during your hospital stay. If you have any questions about your discharge medications or the care you received while you were in the hospital after you are discharged, you can call the unit and asked to speak with the hospitalist on call if the hospitalist that took care of you is not available. Once you are discharged, your  primary care physician will handle any further medical issues. Please note that NO REFILLS for any discharge medications will be authorized once you are discharged, as it is imperative that you return to your primary care physician (or establish a relationship with a primary care physician if you do not have one) for your aftercare needs so that they can reassess your need for medications and monitor your lab values.   Increase activity slowly    Complete by:  As directed      Allergies as of 09/05/2016   No Known Allergies     Medication List    STOP taking these medications   ibuprofen 200 MG tablet Commonly known as:  ADVIL,MOTRIN     TAKE these medications   famotidine 20 MG tablet Commonly known as:  PEPCID Take 1 tablet (20 mg total) by mouth 2 (two) times daily.   HYDROcodone-acetaminophen 5-325 MG tablet Commonly known as:  NORCO/VICODIN Take 1 tablet by mouth every 6 (six) hours as needed for moderate pain.   nicotine 21 mg/24hr patch Commonly known as:  NICODERM CQ - dosed in mg/24 hours Place 1 patch (21 mg total) onto the skin daily. Start taking on:  09/06/2016         Diet and Activity recommendation: See Discharge Instructions above   Consults obtained -  none   Major procedures and Radiology Reports - PLEASE review detailed and final reports for all details, in brief -     Dg Eye Foreign Body  Result Date: 09/04/2016 CLINICAL DATA:  Metal working/exposure; clearance prior to MRI EXAM: ORBITS FOR  FOREIGN BODY - 2 VIEW COMPARISON:  None. FINDINGS: There is no evidence of metallic foreign body within the orbits. No significant bone abnormality identified. IMPRESSION: No evidence of metallic foreign body within the orbits. Electronically Signed   By: Lupita Raider, M.D.   On: 09/04/2016 20:43   Mr Hip Right Wo Contrast  Result Date: 09/04/2016 CLINICAL DATA:  Right hip pain x4 weeks. EXAM: MR OF THE RIGHT HIP WITHOUT CONTRAST TECHNIQUE: Multiplanar, multisequence MR imaging was performed. No intravenous contrast was administered. COMPARISON:  09/03/2016 plain radiographs FINDINGS: Bones: No acute fracture nor bone destruction. No focal bone marrow edema. Articular cartilage and labrum Articular cartilage: No focal chondral defect or significant chondral thinning. Labrum: Small amount fluid in keeping with seen at the base of the superior acetabulum on the right, series 5 image 15 suspicious for labral tear. Joint or bursal effusion Joint effusion:  No significant joint effusion. Bursae: Right greater trochanteric bursal fluid consistent trochanteric bursitis. Muscles and tendons Muscles and tendons:  Intact.  No muscle atrophy or mass. Other findings Miscellaneous: The visualized lumbar spine is maintained without significant disc desiccation. No retroperitoneal mass. The bladder is unremarkable. The prostate is within normal limits for size. Bowel loops demonstrate no acute inflammation or wall thickening. IMPRESSION: 1. Right greater trochanteric bursitis. 2. Small amount of fluid undermines of the superior acetabular labrum of the right hip suspicious for a labral tear. Electronically Signed   By: Tollie Eth M.D.   On: 09/04/2016 21:42   Dg Hip Unilat With Pelvis 2-3 Views Right  Result Date: 09/03/2016 CLINICAL DATA:  Constant right hip pain since climbing off a trailer 3-4 weeks ago. EXAM: DG HIP (WITH OR WITHOUT PELVIS) 2-3V RIGHT COMPARISON:  None. FINDINGS: No fracture.  No bone lesion. The  hip joints are normally spaced and aligned. No significant arthropathic change. The SI joints and symphysis  pubis are normally spaced and aligned. There are scattered ileo femoral vascular calcifications. The soft tissues are otherwise unremarkable. IMPRESSION: 1. No fracture or significant hip joint abnormality. Consider follow-up right hip MRI or MR arthrogram to assess for internal derangement, such as a labral tear. Electronically Signed   By: Amie Portland M.D.   On: 09/03/2016 15:25    Micro Results     No results found for this or any previous visit (from the past 240 hour(s)).     Today   Subjective:   Travis Weeks today has no headache,no chest or abdominal pain,no new weakness tingling or numbness, he complains of right hip pain  Objective:   Blood pressure 127/76, pulse 80, temperature 97.4 F (36.3 C), temperature source Oral, resp. rate 18, height  (1.803 m), weight 64 kg (141 lb), SpO2 100 %.  No intake or output data in the 24 hours ending 09/05/16 1147  Exam Awake Alert, Oriented x 3, Supple Neck,No JVD,  Symmetrical Chest wall movement, Good air movement bilaterally, CTAB RRR,No Gallops,Rubs or new Murmurs, No Parasternal Heave +ve B.Sounds, Abd Soft, Non tender,  No rebound -guarding or rigidity. No Cyanosis, Clubbing or edema, No new Rash or bruise  Data Review   CBC w Diff:  Lab Results  Component Value Date   WBC 8.2 09/05/2016   HGB 15.9 09/05/2016   HCT 44.6 09/05/2016   PLT 270 09/05/2016   LYMPHOPCT 26 09/04/2016   MONOPCT 8 09/04/2016   EOSPCT 2 09/04/2016   BASOPCT 0 09/04/2016    CMP:  Lab Results  Component Value Date   NA 129 (L) 09/05/2016   K 4.1 09/05/2016   CL 96 (L) 09/05/2016   CO2 26 09/05/2016   BUN 7 09/05/2016   CREATININE 0.66 09/05/2016   CREATININE 0.67 (L) 09/03/2016   PROT 7.0 09/04/2016   ALBUMIN 3.6 09/04/2016   BILITOT 0.3 09/04/2016   ALKPHOS 79 09/04/2016   AST 48 (H) 09/04/2016   ALT 32 09/04/2016   .   Total Time in preparing paper work, data evaluation and todays exam - 35 minutes  Elexius Minar M.D on 09/05/2016 at 11:47 AM  [facility]   Office  [phone]

## 2016-09-05 NOTE — Discharge Instructions (Signed)
Follow with Primary MD Jacquelin Hawking, PA-C in 7 days   Get CBC, CMP,  checked  by Primary MD next visit.    Activity: As tolerated with Full fall precautions use walker/cane & assistance as needed   Disposition Home    Diet: Regular diet   On your next visit with your primary care physician please Get Medicines reviewed and adjusted.   Please request your Prim.MD to go over all Hospital Tests and Procedure/Radiological results at the follow up, please get all Hospital records sent to your Prim MD by signing hospital release before you go home.   If you experience worsening of your admission symptoms, develop shortness of breath, life threatening emergency, suicidal or homicidal thoughts you must seek medical attention immediately by calling 911 or calling your MD immediately  if symptoms less severe.  You Must read complete instructions/literature along with all the possible adverse reactions/side effects for all the Medicines you take and that have been prescribed to you. Take any new Medicines after you have completely understood and accpet all the possible adverse reactions/side effects.   Do not drive, operating heavy machinery, perform activities at heights, swimming or participation in water activities or provide baby sitting services if your were admitted for syncope or siezures until you have seen by Primary MD or a Neurologist and advised to do so again.  Do not drive when taking Pain medications.    Do not take more than prescribed Pain, Sleep and Anxiety Medications  Special Instructions: If you have smoked or chewed Tobacco  in the last 2 yrs please stop smoking, stop any regular Alcohol  and or any Recreational drug use.  Wear Seat belts while driving.   Please note  You were cared for by a hospitalist during your hospital stay. If you have any questions about your discharge medications or the care you received while you were in the hospital after you are  discharged, you can call the unit and asked to speak with the hospitalist on call if the hospitalist that took care of you is not available. Once you are discharged, your primary care physician will handle any further medical issues. Please note that NO REFILLS for any discharge medications will be authorized once you are discharged, as it is imperative that you return to your primary care physician (or establish a relationship with a primary care physician if you do not have one) for your aftercare needs so that they can reassess your need for medications and monitor your lab values.

## 2016-09-06 LAB — HIV ANTIBODY (ROUTINE TESTING W REFLEX): HIV SCREEN 4TH GENERATION: NONREACTIVE

## 2016-09-11 ENCOUNTER — Other Ambulatory Visit (HOSPITAL_COMMUNITY)
Admission: RE | Admit: 2016-09-11 | Discharge: 2016-09-11 | Disposition: A | Discharge status: Home / Self Care | Payer: Self-pay | Source: Ambulatory Visit | Attending: Physician Assistant | Admitting: Physician Assistant

## 2016-09-11 ENCOUNTER — Encounter: Payer: Self-pay | Admitting: Physician Assistant

## 2016-09-11 ENCOUNTER — Ambulatory Visit: Payer: Self-pay | Admitting: Physician Assistant

## 2016-09-11 VITALS — BP 154/96 | HR 84 | Temp 98.1°F | Ht 71.5 in | Wt 137.5 lb

## 2016-09-11 DIAGNOSIS — R634 Abnormal weight loss: Secondary | ICD-10-CM

## 2016-09-11 DIAGNOSIS — M25551 Pain in right hip: Secondary | ICD-10-CM

## 2016-09-11 DIAGNOSIS — S3141XA Laceration without foreign body of vagina and vulva, initial encounter: Secondary | ICD-10-CM

## 2016-09-11 DIAGNOSIS — R030 Elevated blood-pressure reading, without diagnosis of hypertension: Secondary | ICD-10-CM

## 2016-09-11 DIAGNOSIS — E871 Hypo-osmolality and hyponatremia: Secondary | ICD-10-CM

## 2016-09-11 DIAGNOSIS — Z1211 Encounter for screening for malignant neoplasm of colon: Secondary | ICD-10-CM

## 2016-09-11 DIAGNOSIS — M7061 Trochanteric bursitis, right hip: Secondary | ICD-10-CM

## 2016-09-11 DIAGNOSIS — F1010 Alcohol abuse, uncomplicated: Secondary | ICD-10-CM

## 2016-09-11 DIAGNOSIS — R195 Other fecal abnormalities: Secondary | ICD-10-CM

## 2016-09-11 DIAGNOSIS — F17219 Nicotine dependence, cigarettes, with unspecified nicotine-induced disorders: Secondary | ICD-10-CM

## 2016-09-11 LAB — BASIC METABOLIC PANEL
Anion gap: 7 (ref 5–15)
BUN: 5 mg/dL — AB (ref 6–20)
CALCIUM: 9.3 mg/dL (ref 8.9–10.3)
CO2: 27 mmol/L (ref 22–32)
Chloride: 95 mmol/L — ABNORMAL LOW (ref 101–111)
Creatinine, Ser: 0.64 mg/dL (ref 0.61–1.24)
GFR calc Af Amer: >60 mL/min (ref >60)
GLUCOSE: 105 mg/dL — AB (ref 65–99)
Potassium: 5 mmol/L (ref 3.5–5.1)
Sodium: 129 mmol/L — ABNORMAL LOW (ref 135–145)

## 2016-09-11 LAB — IFOBT (OCCULT BLOOD): IFOBT: POSITIVE

## 2016-09-11 NOTE — Progress Notes (Signed)
BP (!) 154/96 (BP Location: Left Arm, Patient Position: Sitting, Cuff Size: Normal)   Pulse 84   Temp 98.1 F (36.7 C) (Other (Comment))   Ht 5' 11.5" (1.816 m)   Wt 137 lb 8 oz (62.4 kg)   SpO2 99%   BMI 18.91 kg/m    Subjective:    Patient ID: Travis Weeks, male    DOB: 12-16-1965, 51 y.o.   MRN: [id number]  HPI: Travis Weeks is a 51 y.o. male presenting on 09/11/2016 for Follow-up   HPI   [facility] discount application not turned in yet.  He brought it with him today.  Reminded pt that we are not where he needs to turn it in.  He needs to mail it to the address on the form.   Discussed honest answers- ie was not forthcoming about alcohol at his initial appointment (when he said he only drank alcohol once/week) and that makes it more difficult to best care for him.  Pt says he will try to be more honest from now on.  Pt brought his iFOBT to office today.  Test  +  Pt says he has cut back on etoh since getting out of the hospital he says because he doesn't have any money.    Pt says he didn't get the famotidine b/c he thought it was expensive.  Pt says his main conern is his hip which is very painful.   Relevant past medical, surgical, family and social history reviewed and updated as indicated. Interim medical history since our last visit reviewed. Allergies and medications reviewed and updated.  Review of Systems  Constitutional: Negative for appetite change, chills, diaphoresis, fatigue, fever and unexpected weight change.  HENT: Positive for dental problem. Negative for congestion, drooling, ear pain, facial swelling, hearing loss, mouth sores, sneezing, sore throat, trouble swallowing and voice change.   Eyes: Negative for pain, discharge, redness, itching and visual disturbance.  Respiratory: Negative for cough, choking, shortness of breath and wheezing.   Cardiovascular: Negative for chest pain, palpitations and leg swelling.  Gastrointestinal: Negative for abdominal pain,  blood in stool, constipation, diarrhea and vomiting.  Endocrine: Negative for cold intolerance, heat intolerance and polydipsia.  Genitourinary: Negative for decreased urine volume, dysuria and hematuria.  Musculoskeletal: Positive for arthralgias (R hip) and gait problem. Negative for back pain.  Skin: Negative for rash.  Allergic/Immunologic: Negative for environmental allergies.  Neurological: Negative for seizures, syncope, light-headedness and headaches.  Hematological: Negative for adenopathy.  Psychiatric/Behavioral: Negative for agitation, dysphoric mood and suicidal ideas. The patient is not nervous/anxious.     Per HPI unless specifically indicated above     Objective:    BP (!) 154/96 (BP Location: Left Arm, Patient Position: Sitting, Cuff Size: Normal)   Pulse 84   Temp 98.1 F (36.7 C) (Other (Comment))   Ht 5' 11.5" (1.816 m)   Wt 137 lb 8 oz (62.4 kg)   SpO2 99%   BMI 18.91 kg/m   Wt Readings from Last 3 Encounters:  09/11/16 137 lb 8 oz (62.4 kg)  09/04/16 141 lb (64 kg)  09/03/16 141 lb 4 oz (64.1 kg)    Physical Exam  Constitutional: He is oriented to person, place, and time.  Thin. Appears older than stated age.  HENT:  Head: Normocephalic and atraumatic.  Neck: Neck supple.  Cardiovascular: Normal rate and regular rhythm.   Pulmonary/Chest: Effort normal and breath sounds normal. He has no wheezes.  Abdominal: Soft. Bowel sounds are normal. There  is no hepatosplenomegaly. There is no tenderness.  Musculoskeletal: He exhibits no edema.  Significant pain with walking, any movement of R hip  Lymphadenopathy:    He has no cervical adenopathy.  Neurological: He is alert and oriented to person, place, and time.  Skin: Skin is warm and dry.  Psychiatric: He has a normal mood and affect. His behavior is normal.  Vitals reviewed.   Results for orders placed or performed during the hospital encounter of 09/04/16  Comprehensive metabolic panel  Result Value  Ref Range   Sodium 122 (L) 135 - 145 mmol/L   Potassium 4.5 3.5 - 5.1 mmol/L   Chloride 88 (L) 101 - 111 mmol/L   CO2 26 22 - 32 mmol/L   Glucose, Bld 88 65 - 99 mg/dL   BUN 6 6 - 20 mg/dL   Creatinine, Ser 9.140.64 0.61 - 1.24 mg/dL   Calcium 9.0 8.9 - 78.210.3 mg/dL   Total Protein 7.0 6.5 - 8.1 g/dL   Albumin 3.6 3.5 - 5.0 g/dL   AST 48 (H) 15 - 41 U/L   ALT 32 17 - 63 U/L   Alkaline Phosphatase 79 38 - 126 U/L   Total Bilirubin 0.3 0.3 - 1.2 mg/dL   GFR calc non Af Amer >60 >60 mL/min   GFR calc Af Amer >60 >60 mL/min   Anion gap 8 5 - 15  CBC with Differential  Result Value Ref Range   WBC 8.8 4.0 - 10.5 K/uL   RBC 4.60 4.22 - 5.81 MIL/uL   Hemoglobin 15.6 13.0 - 17.0 g/dL   HCT 95.643.2 21.339.0 - 08.652.0 %   MCV 93.9 78.0 - 100.0 fL   MCH 33.9 26.0 - 34.0 pg   MCHC 36.1 (H) 30.0 - 36.0 g/dL   RDW 57.811.8 46.911.5 - 62.915.5 %   Platelets 275 150 - 400 K/uL   Neutrophils Relative % 64 %   Neutro Abs 5.6 1.7 - 7.7 K/uL   Lymphocytes Relative 26 %   Lymphs Abs 2.3 0.7 - 4.0 K/uL   Monocytes Relative 8 %   Monocytes Absolute 0.7 0.1 - 1.0 K/uL   Eosinophils Relative 2 %   Eosinophils Absolute 0.2 0.0 - 0.7 K/uL   Basophils Relative 0 %   Basophils Absolute 0.0 0.0 - 0.1 K/uL  Osmolality  Result Value Ref Range   Osmolality 270 (L) 275 - 295 mOsm/kg  TSH  Result Value Ref Range   TSH 1.795 0.350 - 4.500 uIU/mL  Sodium, urine, random  Result Value Ref Range   Sodium, Ur <10 mmol/L  Osmolality, urine  Result Value Ref Range   Osmolality, Ur 139 (L) 300 - 900 mOsm/kg  Urinalysis, Routine w reflex microscopic  Result Value Ref Range   Color, Urine YELLOW YELLOW   APPearance CLEAR CLEAR   Specific Gravity, Urine 1.004 (L) 1.005 - 1.030   pH 5.0 5.0 - 8.0   Glucose, UA NEGATIVE NEGATIVE mg/dL   Hgb urine dipstick NEGATIVE NEGATIVE   Bilirubin Urine NEGATIVE NEGATIVE   Ketones, ur NEGATIVE NEGATIVE mg/dL   Protein, ur NEGATIVE NEGATIVE mg/dL   Nitrite NEGATIVE NEGATIVE   Leukocytes, UA  NEGATIVE NEGATIVE  Lipid panel  Result Value Ref Range   Cholesterol 121 0 - 200 mg/dL   Triglycerides 59 <528<150 mg/dL   HDL 67 >41>40 mg/dL   Total CHOL/HDL Ratio 1.8 RATIO   VLDL 12 0 - 40 mg/dL   LDL Cholesterol 42 0 - 99 mg/dL  Basic  metabolic panel  Result Value Ref Range   Sodium 123 (L) 135 - 145 mmol/L   Potassium 4.1 3.5 - 5.1 mmol/L   Chloride 92 (L) 101 - 111 mmol/L   CO2 24 22 - 32 mmol/L   Glucose, Bld 98 65 - 99 mg/dL   BUN 5 (L) 6 - 20 mg/dL   Creatinine, Ser 1.61 (L) 0.61 - 1.24 mg/dL   Calcium 8.4 (L) 8.9 - 10.3 mg/dL   GFR calc non Af Amer >60 >60 mL/min   GFR calc Af Amer >60 >60 mL/min   Anion gap 7 5 - 15  Basic metabolic panel  Result Value Ref Range   Sodium 129 (L) 135 - 145 mmol/L   Potassium 4.1 3.5 - 5.1 mmol/L   Chloride 96 (L) 101 - 111 mmol/L   CO2 26 22 - 32 mmol/L   Glucose, Bld 93 65 - 99 mg/dL   BUN 7 6 - 20 mg/dL   Creatinine, Ser 0.96 0.61 - 1.24 mg/dL   Calcium 9.0 8.9 - 04.5 mg/dL   GFR calc non Af Amer >60 >60 mL/min   GFR calc Af Amer >60 >60 mL/min   Anion gap 7 5 - 15  CBC  Result Value Ref Range   WBC 8.2 4.0 - 10.5 K/uL   RBC 4.68 4.22 - 5.81 MIL/uL   Hemoglobin 15.9 13.0 - 17.0 g/dL   HCT 40.9 81.1 - 91.4 %   MCV 95.3 78.0 - 100.0 fL   MCH 34.0 26.0 - 34.0 pg   MCHC 35.7 30.0 - 36.0 g/dL   RDW 78.2 95.6 - 21.3 %   Platelets 270 150 - 400 K/uL  Urine rapid drug screen (hosp performed)  Result Value Ref Range   Opiates NONE DETECTED NONE DETECTED   Cocaine POSITIVE (A) NONE DETECTED   Benzodiazepines POSITIVE (A) NONE DETECTED   Amphetamines NONE DETECTED NONE DETECTED   Tetrahydrocannabinol NONE DETECTED NONE DETECTED   Barbiturates NONE DETECTED NONE DETECTED  HIV antibody  Result Value Ref Range   HIV Screen 4th Generation wRfx Non Reactive Non Reactive  Basic metabolic panel  Result Value Ref Range   Sodium 128 (L) 135 - 145 mmol/L   Potassium 5.0 3.5 - 5.1 mmol/L   Chloride 95 (L) 101 - 111 mmol/L   CO2 27 22 -  32 mmol/L   Glucose, Bld 88 65 - 99 mg/dL   BUN 8 6 - 20 mg/dL   Creatinine, Ser 0.86 0.61 - 1.24 mg/dL   Calcium 8.9 8.9 - 57.8 mg/dL   GFR calc non Af Amer >60 >60 mL/min   GFR calc Af Amer >60 >60 mL/min   Anion gap 6 5 - 15      Assessment & Plan:    Encounter Diagnoses  Name Primary?  . Hyponatremia Yes  . Alcohol abuse   . Cigarette nicotine dependence with nicotine-induced disorder   . Right hip pain   . Screening for colon cancer   . Occult blood positive stool   . Weight loss   . Trochanteric bursitis of right hip   . Tear of labium, initial encounter   . Elevated blood pressure reading in office without diagnosis of hypertension      -reviewed labs with pt -Pt to get BMP today to recheck Na+ -pt was informed that pepcid rx is $4 at [facility].  -will Refer to GI for + iFOBT -will Refer to orthopedics for hip pain -will Monitor bp.  It  has been high intermittently but was good at last OV.  -Gave [employer] card to pt to get assistance with substance abuse and also gave him list of local AA meetings. -follow up  2 wk (pt didn't want to wait a month b/c he says he must get appt with ortho sooner than a month due to the severe pain)

## 2016-09-15 ENCOUNTER — Encounter: Payer: Self-pay | Admitting: Internal Medicine

## 2016-09-25 ENCOUNTER — Ambulatory Visit: Payer: Self-pay | Admitting: Physician Assistant

## 2016-09-29 ENCOUNTER — Encounter: Payer: Self-pay | Admitting: Physician Assistant

## 2016-09-29 ENCOUNTER — Ambulatory Visit: Payer: Self-pay | Admitting: Physician Assistant

## 2016-09-29 ENCOUNTER — Other Ambulatory Visit (HOSPITAL_COMMUNITY)
Admission: RE | Admit: 2016-09-29 | Discharge: 2016-09-29 | Disposition: A | Discharge status: Home / Self Care | Payer: Self-pay | Source: Ambulatory Visit | Attending: Physician Assistant | Admitting: Physician Assistant

## 2016-09-29 VITALS — BP 130/80 | HR 97 | Temp 97.9°F | Ht 71.5 in | Wt 142.0 lb

## 2016-09-29 DIAGNOSIS — F17219 Nicotine dependence, cigarettes, with unspecified nicotine-induced disorders: Secondary | ICD-10-CM

## 2016-09-29 DIAGNOSIS — R634 Abnormal weight loss: Secondary | ICD-10-CM

## 2016-09-29 DIAGNOSIS — R195 Other fecal abnormalities: Secondary | ICD-10-CM

## 2016-09-29 DIAGNOSIS — E871 Hypo-osmolality and hyponatremia: Secondary | ICD-10-CM

## 2016-09-29 DIAGNOSIS — F1010 Alcohol abuse, uncomplicated: Secondary | ICD-10-CM

## 2016-09-29 LAB — BASIC METABOLIC PANEL
Anion gap: 8 (ref 5–15)
CHLORIDE: 96 mmol/L — AB (ref 101–111)
CO2: 23 mmol/L (ref 22–32)
CREATININE: 0.56 mg/dL — AB (ref 0.61–1.24)
Calcium: 8.9 mg/dL (ref 8.9–10.3)
GFR calc Af Amer: >60 mL/min (ref >60)
GFR calc non Af Amer: >60 mL/min (ref >60)
GLUCOSE: 57 mg/dL — AB (ref 65–99)
POTASSIUM: 4.1 mmol/L (ref 3.5–5.1)
Sodium: 127 mmol/L — ABNORMAL LOW (ref 135–145)

## 2016-09-29 NOTE — Progress Notes (Signed)
BP 130/80 (BP Location: Left Arm, Patient Position: Sitting, Cuff Size: Normal)   Pulse 97   Temp 97.9 F (36.6 C)   Ht 5' 11.5" (1.816 m)   Wt 142 lb (64.4 kg)   SpO2 98%   BMI 19.53 kg/m    Subjective:    Patient ID: Travis Weeks, male    DOB: 08-13-1965, 51 y.o.   MRN: [id number]  HPI: Travis Weeks is a 51 y.o. male presenting on 09/29/2016 for Follow-up   HPI  Pt says he turned in his [facility] [employer] application.  He says he was told he needed to apply for medicaid.   He hasn't done that yet.   He is eating more, drinking less alcohol.  He is still drinking, however.   Pt is planning to cancel his GI appt he says b/c he he hasn't been approved yet for [facility] discount   Relevant past medical, surgical, family and social history reviewed and updated as indicated. Interim medical history since our last visit reviewed. Allergies and medications reviewed and updated.   Current Outpatient Prescriptions:  .  famotidine (PEPCID) 20 MG tablet, Take 1 tablet (20 mg total) by mouth 2 (two) times daily., Disp: 60 tablet, Rfl:  .  HYDROcodone-acetaminophen (NORCO/VICODIN) 5-325 MG tablet, Take 1 tablet by mouth every 6 (six) hours as needed for moderate pain., Disp: 15 tablet, Rfl: 0 .  nicotine (NICODERM CQ - DOSED IN MG/24 HOURS) 21 mg/24hr patch, Place 1 patch (21 mg total) onto the skin daily. (Patient not taking: Reported on 09/11/2016), Disp: 28 patch, Rfl: 0  Review of Systems  Constitutional: Negative for appetite change, chills, diaphoresis, fatigue, fever and unexpected weight change.  HENT: Positive for dental problem. Negative for congestion, drooling, ear pain, facial swelling, hearing loss, mouth sores, sneezing, sore throat, trouble swallowing and voice change.   Eyes: Negative for pain, discharge, redness, itching and visual disturbance.  Respiratory: Negative for cough, choking, shortness of breath and wheezing.   Cardiovascular: Negative for chest pain, palpitations and leg  swelling.  Gastrointestinal: Positive for blood in stool. Negative for abdominal pain, constipation, diarrhea and vomiting.  Endocrine: Negative for cold intolerance, heat intolerance and polydipsia.  Genitourinary: Negative for decreased urine volume, dysuria and hematuria.  Musculoskeletal: Positive for gait problem. Negative for arthralgias and back pain.  Skin: Negative for rash.  Allergic/Immunologic: Negative for environmental allergies.  Neurological: Negative for seizures, syncope, light-headedness and headaches.  Hematological: Negative for adenopathy.  Psychiatric/Behavioral: Negative for agitation, dysphoric mood and suicidal ideas. The patient is not nervous/anxious.     Per HPI unless specifically indicated above     Objective:    BP 130/80 (BP Location: Left Arm, Patient Position: Sitting, Cuff Size: Normal)   Pulse 97   Temp 97.9 F (36.6 C)   Ht 5' 11.5" (1.816 m)   Wt 142 lb (64.4 kg)   SpO2 98%   BMI 19.53 kg/m   Wt Readings from Last 3 Encounters:  09/29/16 142 lb (64.4 kg)  09/11/16 137 lb 8 oz (62.4 kg)  09/04/16 141 lb (64 kg)    Physical Exam  Constitutional: He is oriented to person, place, and time. He appears well-developed and well-nourished.  HENT:  Head: Normocephalic and atraumatic.  Neck: Neck supple.  Cardiovascular: Normal rate and regular rhythm.   Pulmonary/Chest: Effort normal and breath sounds normal. He has no wheezes.  Abdominal: Soft. Bowel sounds are normal. There is no hepatosplenomegaly. There is no tenderness.  Musculoskeletal: He exhibits  no edema.  Lymphadenopathy:    He has no cervical adenopathy.  Neurological: He is alert and oriented to person, place, and time.  Skin: Skin is warm and dry.  Psychiatric: He has a normal mood and affect. His behavior is normal.  Vitals reviewed.   Results for orders placed or performed during the hospital encounter of 09/11/16  Basic metabolic panel  Result Value Ref Range   Sodium 129  (L) 135 - 145 mmol/L   Potassium 5.0 3.5 - 5.1 mmol/L   Chloride 95 (L) 101 - 111 mmol/L   CO2 27 22 - 32 mmol/L   Glucose, Bld 105 (H) 65 - 99 mg/dL   BUN 5 (L) 6 - 20 mg/dL   Creatinine, Ser 8.650.64 0.61 - 1.24 mg/dL   Calcium 9.3 8.9 - 78.410.3 mg/dL   GFR calc non Af Amer >60 >60 mL/min   GFR calc Af Amer >60 >60 mL/min   Anion gap 7 5 - 15      Assessment & Plan:   Encounter Diagnoses  Name Primary?  . Hyponatremia Yes  . Alcohol abuse   . Cigarette nicotine dependence with nicotine-induced disorder   . Occult blood positive stool   . Weight loss      -Recheck bmp today -pt urged to apply for medicaid/medicare so he can get his [facility] discount -encouraged pt to go ahead and call the GI office right away if he is planning on not going to his appt -encouraged pt to continue eating protein rich high calorie food -encouraged to abstain from etoh -F/u 1 month. RTO sooner prn

## 2016-10-03 ENCOUNTER — Ambulatory Visit: Payer: Self-pay | Admitting: Gastroenterology

## 2016-10-16 ENCOUNTER — Other Ambulatory Visit (HOSPITAL_COMMUNITY)
Admission: RE | Admit: 2016-10-16 | Discharge: 2016-10-16 | Disposition: A | Discharge status: Home / Self Care | Payer: Self-pay | Source: Ambulatory Visit | Attending: Physician Assistant | Admitting: Physician Assistant

## 2016-10-16 LAB — BASIC METABOLIC PANEL
ANION GAP: 8 (ref 5–15)
BUN: <5 mg/dL — ABNORMAL LOW (ref 6–20)
CALCIUM: 9 mg/dL (ref 8.9–10.3)
CO2: 26 mmol/L (ref 22–32)
Chloride: 95 mmol/L — ABNORMAL LOW (ref 101–111)
Creatinine, Ser: 0.63 mg/dL (ref 0.61–1.24)
Glucose, Bld: 87 mg/dL (ref 65–99)
Potassium: 4.5 mmol/L (ref 3.5–5.1)
SODIUM: 129 mmol/L — AB (ref 135–145)

## 2016-10-29 ENCOUNTER — Other Ambulatory Visit (HOSPITAL_COMMUNITY)
Admission: RE | Admit: 2016-10-29 | Discharge: 2016-10-29 | Disposition: A | Discharge status: Home / Self Care | Payer: Self-pay | Source: Ambulatory Visit | Attending: Physician Assistant | Admitting: Physician Assistant

## 2016-10-29 ENCOUNTER — Ambulatory Visit: Payer: Self-pay | Admitting: Physician Assistant

## 2016-10-29 ENCOUNTER — Encounter: Payer: Self-pay | Admitting: Physician Assistant

## 2016-10-29 VITALS — BP 158/92 | HR 74 | Temp 97.7°F | Ht 71.5 in | Wt 144.0 lb

## 2016-10-29 DIAGNOSIS — R634 Abnormal weight loss: Secondary | ICD-10-CM

## 2016-10-29 DIAGNOSIS — F1010 Alcohol abuse, uncomplicated: Secondary | ICD-10-CM

## 2016-10-29 DIAGNOSIS — R030 Elevated blood-pressure reading, without diagnosis of hypertension: Secondary | ICD-10-CM | POA: Insufficient documentation

## 2016-10-29 DIAGNOSIS — F17219 Nicotine dependence, cigarettes, with unspecified nicotine-induced disorders: Secondary | ICD-10-CM

## 2016-10-29 DIAGNOSIS — E871 Hypo-osmolality and hyponatremia: Secondary | ICD-10-CM

## 2016-10-29 DIAGNOSIS — R195 Other fecal abnormalities: Secondary | ICD-10-CM

## 2016-10-29 LAB — ELECTROLYTE PANEL
Anion gap: 7 (ref 5–15)
CO2: 26 mmol/L (ref 22–32)
Chloride: 97 mmol/L — ABNORMAL LOW (ref 101–111)
POTASSIUM: 4.3 mmol/L (ref 3.5–5.1)
Sodium: 130 mmol/L — ABNORMAL LOW (ref 135–145)

## 2016-10-29 NOTE — Progress Notes (Signed)
BP (!) 160/88 (BP Location: Left Arm, Patient Position: Sitting, Cuff Size: Normal)   Pulse 74   Temp 97.7 F (36.5 C)   Ht 5' 11.5" (1.816 m)   Wt 144 lb (65.3 kg)   SpO2 99%   BMI 19.80 kg/m    Subjective:    Patient ID: Travis Weeks, male    DOB: 03-23-1966, 51 y.o.   MRN: [id number]  HPI: Travis Weeks is a 51 y.o. male presenting on 10/29/2016 for Follow-up   HPI   Pt has applied for medicaid and it is in process (he has letter stating that he needs to call with additional information.   He is still drinking some.  He says "nothing major"   He drinks 3-4 beer every night.   He says he is not using cocaine any more.   Most recent labs 2 wk ago.     Relevant past medical, surgical, family and social history reviewed and updated as indicated. Interim medical history since our last visit reviewed. Allergies and medications reviewed and updated.   Current Outpatient Prescriptions:  .  Diphenhydramine-APAP, sleep, (GOODYS PM PO), Take by mouth as needed., Disp: , Rfl:  .  famotidine (PEPCID) 20 MG tablet, Take 1 tablet (20 mg total) by mouth 2 (two) times daily., Disp: 60 tablet, Rfl:   Review of Systems  Constitutional: Negative for appetite change, chills, diaphoresis, fatigue, fever and unexpected weight change.  HENT: Positive for dental problem and ear pain. Negative for congestion, drooling, facial swelling, hearing loss, mouth sores, sneezing, sore throat, trouble swallowing and voice change.   Eyes: Negative for pain, discharge, redness, itching and visual disturbance.  Respiratory: Negative for cough, choking, shortness of breath and wheezing.   Cardiovascular: Negative for chest pain, palpitations and leg swelling.  Gastrointestinal: Negative for abdominal pain, constipation, diarrhea and vomiting.  Endocrine: Negative for cold intolerance, heat intolerance and polydipsia.  Genitourinary: Negative for decreased urine volume, dysuria and hematuria.  Musculoskeletal:  Positive for gait problem. Negative for arthralgias and back pain.  Skin: Negative for rash.  Allergic/Immunologic: Negative for environmental allergies.  Neurological: Negative for seizures, syncope, light-headedness and headaches.  Hematological: Negative for adenopathy.  Psychiatric/Behavioral: Negative for agitation, dysphoric mood and suicidal ideas. The patient is not nervous/anxious.     Per HPI unless specifically indicated above     Objective:    BP (!) 160/88 (BP Location: Left Arm, Patient Position: Sitting, Cuff Size: Normal)   Pulse 74   Temp 97.7 F (36.5 C)   Ht 5' 11.5" (1.816 m)   Wt 144 lb (65.3 kg)   SpO2 99%   BMI 19.80 kg/m   Wt Readings from Last 3 Encounters:  10/29/16 144 lb (65.3 kg)  09/29/16 142 lb (64.4 kg)  09/11/16 137 lb 8 oz (62.4 kg)    Physical Exam  Constitutional: He is oriented to person, place, and time. He appears well-developed and well-nourished.  HENT:  Head: Normocephalic and atraumatic.  Neck: Neck supple.  Cardiovascular: Normal rate and regular rhythm.   Pulmonary/Chest: Effort normal and breath sounds normal. He has no wheezes.  Abdominal: Soft. Bowel sounds are normal. There is no hepatosplenomegaly. There is no tenderness.  Musculoskeletal: He exhibits no edema.  Lymphadenopathy:    He has no cervical adenopathy.  Neurological: He is alert and oriented to person, place, and time.  Skin: Skin is warm and dry.  Psychiatric: He has a normal mood and affect. His behavior is normal.  Vitals reviewed.  Assessment & Plan:    Encounter Diagnoses  Name Primary?  . Hyponatremia Yes  . Alcohol abuse   . Elevated blood pressure reading in office without diagnosis of hypertension   . Weight loss   . Cigarette nicotine dependence with nicotine-induced disorder   . Occult blood positive stool     Discussed etoh with pt.  Discussed that if he doesn't stop drinking, it will likely kill him -pt Still needs GI for + iFOBT.  Recommended pt reschedule the appt that he canceled -will Monitor bp- it has been up and down -[place] to work on [employer] [place] -Check electrolytes today when leaves office -follow up 1 month. RTO sooner prn

## 2016-11-26 ENCOUNTER — Ambulatory Visit: Payer: Self-pay | Admitting: Physician Assistant

## 2016-12-03 ENCOUNTER — Ambulatory Visit: Payer: Self-pay | Admitting: Physician Assistant

## 2016-12-03 ENCOUNTER — Other Ambulatory Visit (HOSPITAL_COMMUNITY)
Admission: RE | Admit: 2016-12-03 | Discharge: 2016-12-03 | Disposition: A | Discharge status: Home / Self Care | Payer: Self-pay | Source: Ambulatory Visit | Attending: Physician Assistant | Admitting: Physician Assistant

## 2016-12-03 ENCOUNTER — Encounter: Payer: Self-pay | Admitting: Physician Assistant

## 2016-12-03 VITALS — BP 148/94 | HR 93 | Temp 98.1°F | Ht 71.5 in | Wt 145.5 lb

## 2016-12-03 DIAGNOSIS — F1010 Alcohol abuse, uncomplicated: Secondary | ICD-10-CM

## 2016-12-03 DIAGNOSIS — I10 Essential (primary) hypertension: Secondary | ICD-10-CM

## 2016-12-03 DIAGNOSIS — R634 Abnormal weight loss: Secondary | ICD-10-CM

## 2016-12-03 DIAGNOSIS — E871 Hypo-osmolality and hyponatremia: Secondary | ICD-10-CM

## 2016-12-03 DIAGNOSIS — F17219 Nicotine dependence, cigarettes, with unspecified nicotine-induced disorders: Secondary | ICD-10-CM

## 2016-12-03 DIAGNOSIS — R195 Other fecal abnormalities: Secondary | ICD-10-CM

## 2016-12-03 DIAGNOSIS — K219 Gastro-esophageal reflux disease without esophagitis: Secondary | ICD-10-CM

## 2016-12-03 LAB — BASIC METABOLIC PANEL
Anion gap: 9 (ref 5–15)
BUN: 5 mg/dL — AB (ref 6–20)
CHLORIDE: 97 mmol/L — AB (ref 101–111)
CO2: 26 mmol/L (ref 22–32)
CREATININE: 0.68 mg/dL (ref 0.61–1.24)
Calcium: 9.3 mg/dL (ref 8.9–10.3)
GFR calc Af Amer: >60 mL/min (ref >60)
GFR calc non Af Amer: >60 mL/min (ref >60)
Glucose, Bld: 57 mg/dL — ABNORMAL LOW (ref 65–99)
Potassium: 3.9 mmol/L (ref 3.5–5.1)
SODIUM: 132 mmol/L — AB (ref 135–145)

## 2016-12-03 MED ORDER — LISINOPRIL 10 MG PO TABS: 10.0000 mg | ORAL_TABLET | Freq: Every day | ORAL | 1 refills | Status: DC

## 2016-12-03 MED ORDER — FAMOTIDINE 20 MG PO TABS: 20.0000 mg | ORAL_TABLET | Freq: Two times a day (BID) | ORAL | 3 refills | Status: DC

## 2016-12-03 NOTE — Progress Notes (Signed)
BP (!) 148/94 (BP Location: Left Arm, Patient Position: Sitting, Cuff Size: Normal)   Pulse 93   Temp 98.1 F (36.7 C) (Other (Comment))   Ht 5' 11.5" (1.816 m)   Wt 145 lb 8 oz (66 kg)   SpO2 99%   BMI 20.01 kg/m    Subjective:    Patient ID: Travis Weeks, male    DOB: 01/10/1966, 51 y.o.   MRN: [id number]  HPI: Travis Weeks is a 51 y.o. male presenting on 12/03/2016 for Follow-up (has appt with GI 01-06-17)   HPI   Pt says he got a letter about the medicaid and says he can get a vasectomy.  Pt did not bring letter with him.   He [facility]  The [employer] discount was pending on his medicaid application.   Pt states he is "slowing down" with his alcohol consumption- "only had 3 or 4 since Sunday" (today is Wednesday).  He is cut back his smoking to just under a pack/day.  He denies cocaine and marijuana use at this time.  Pt weight is up 1 pound from last month, 3 pounds from May.     Relevant past medical, surgical, family and social history reviewed and updated as indicated. Interim medical history since our last visit reviewed. Allergies and medications reviewed and updated.   Current Outpatient Prescriptions:  .  Aspirin-Acetaminophen-Caffeine (GOODY HEADACHE PO), Take by mouth., Disp: , Rfl:  .  famotidine (PEPCID) 20 MG tablet, Take 1 tablet (20 mg total) by mouth 2 (two) times daily. (Patient not taking: Reported on 12/03/2016), Disp: 60 tablet, Rfl:    Review of Systems  Constitutional: Negative for appetite change, chills, diaphoresis, fatigue, fever and unexpected weight change.  HENT: Positive for dental problem. Negative for congestion, drooling, ear pain, facial swelling, hearing loss, mouth sores, sneezing, sore throat, trouble swallowing and voice change.   Eyes: Positive for visual disturbance. Negative for pain, discharge, redness and itching.  Respiratory: Negative for  cough, choking, shortness of breath and wheezing.   Cardiovascular: Negative for chest pain, palpitations and leg swelling.  Gastrointestinal: Negative for abdominal pain, constipation, diarrhea and vomiting.  Endocrine: Negative for cold intolerance, heat intolerance and polydipsia.  Genitourinary: Negative for decreased urine volume, dysuria and hematuria.  Musculoskeletal: Positive for gait problem. Negative for arthralgias and back pain.  Skin: Negative for rash.  Allergic/Immunologic: Negative for environmental allergies.  Neurological: Negative for seizures, syncope, light-headedness and headaches.  Hematological: Negative for adenopathy.  Psychiatric/Behavioral: Negative for agitation, dysphoric mood and suicidal ideas. The patient is not nervous/anxious.     Per HPI unless specifically indicated above     Objective:    BP (!) 148/94 (BP Location: Left Arm, Patient Position: Sitting, Cuff Size: Normal)   Pulse 93   Temp 98.1 F (36.7 C) (Other (Comment))   Ht 5' 11.5" (1.816 m)   Wt 145 lb 8 oz (66 kg)   SpO2 99%   BMI 20.01 kg/m   Wt Readings from Last 3 Encounters:  12/03/16 145 lb 8 oz (66 kg)  10/29/16 144 lb (65.3 kg)  09/29/16 142 lb (64.4 kg)    Physical Exam  Constitutional: He is oriented to person, place, and time. He appears well-developed and well-nourished.  HENT:  Head: Normocephalic and atraumatic.  Neck: Neck supple.  Cardiovascular: Normal rate and regular rhythm.   Pulmonary/Chest: Effort normal and breath  sounds normal. He has no wheezes.  Abdominal: Soft. Bowel sounds are normal. There is no hepatosplenomegaly. There is no tenderness.  Musculoskeletal: He exhibits no edema.  Lymphadenopathy:    He has no cervical adenopathy.  Neurological: He is alert and oriented to person, place, and time.  Skin: Skin is warm and dry.  Psychiatric: He has a normal mood and affect. His behavior is normal.  Vitals reviewed.   Results for orders placed or  performed during the hospital encounter of 10/29/16  Electrolyte panel  Result Value Ref Range   Sodium 130 (L) 135 - 145 mmol/L   Potassium 4.3 3.5 - 5.1 mmol/L   Chloride 97 (L) 101 - 111 mmol/L   CO2 26 22 - 32 mmol/L   Anion gap 7 5 - 15      Assessment & Plan:    Encounter Diagnoses  Name Primary?  . Essential hypertension Yes  . Hyponatremia   . Alcohol abuse   . Cigarette nicotine dependence with nicotine-induced disorder   . Occult blood positive stool   . Weight loss   . Gastroesophageal reflux disease, esophagitis presence not specified      -rx lisinopril for bp -Pt to get back on pepcid.  -Recheck electrolytes today -pt counseled to avoid alcohol -counseled pt to [facility] his [facility] away -pt to follow up in 1 month to recheck bp

## 2017-01-06 ENCOUNTER — Other Ambulatory Visit: Payer: Self-pay

## 2017-01-06 ENCOUNTER — Encounter: Payer: Self-pay | Admitting: Physician Assistant

## 2017-01-06 ENCOUNTER — Ambulatory Visit: Payer: Medicaid Other | Admitting: Physician Assistant

## 2017-01-06 ENCOUNTER — Other Ambulatory Visit: Payer: Self-pay | Admitting: Physician Assistant

## 2017-01-06 ENCOUNTER — Other Ambulatory Visit (HOSPITAL_COMMUNITY)
Admission: RE | Admit: 2017-01-06 | Discharge: 2017-01-06 | Disposition: A | Discharge status: Home / Self Care | Payer: Self-pay | Source: Ambulatory Visit | Attending: Physician Assistant | Admitting: Physician Assistant

## 2017-01-06 ENCOUNTER — Ambulatory Visit (INDEPENDENT_AMBULATORY_CARE_PROVIDER_SITE_OTHER): Payer: Self-pay | Admitting: Gastroenterology

## 2017-01-06 ENCOUNTER — Encounter: Payer: Self-pay | Admitting: Gastroenterology

## 2017-01-06 ENCOUNTER — Other Ambulatory Visit (HOSPITAL_COMMUNITY): Admission: RE | Admit: 2017-01-06 | Payer: Self-pay | Source: Ambulatory Visit

## 2017-01-06 VITALS — BP 142/87 | HR 74 | Temp 96.7°F | Ht 71.0 in | Wt 148.6 lb

## 2017-01-06 VITALS — BP 136/82 | HR 74 | Temp 97.7°F | Ht 71.5 in | Wt 148.2 lb

## 2017-01-06 DIAGNOSIS — R1013 Epigastric pain: Secondary | ICD-10-CM

## 2017-01-06 DIAGNOSIS — R945 Abnormal results of liver function studies: Secondary | ICD-10-CM | POA: Insufficient documentation

## 2017-01-06 DIAGNOSIS — I10 Essential (primary) hypertension: Secondary | ICD-10-CM

## 2017-01-06 DIAGNOSIS — E871 Hypo-osmolality and hyponatremia: Secondary | ICD-10-CM

## 2017-01-06 DIAGNOSIS — R7989 Other specified abnormal findings of blood chemistry: Secondary | ICD-10-CM

## 2017-01-06 DIAGNOSIS — R195 Other fecal abnormalities: Secondary | ICD-10-CM

## 2017-01-06 DIAGNOSIS — F1010 Alcohol abuse, uncomplicated: Secondary | ICD-10-CM

## 2017-01-06 DIAGNOSIS — K921 Melena: Secondary | ICD-10-CM

## 2017-01-06 DIAGNOSIS — F17219 Nicotine dependence, cigarettes, with unspecified nicotine-induced disorders: Secondary | ICD-10-CM

## 2017-01-06 LAB — BASIC METABOLIC PANEL
Anion gap: 9 (ref 5–15)
BUN: 5 mg/dL — AB (ref 6–20)
CHLORIDE: 95 mmol/L — AB (ref 101–111)
CO2: 24 mmol/L (ref 22–32)
CREATININE: 0.66 mg/dL (ref 0.61–1.24)
Calcium: 8.8 mg/dL — ABNORMAL LOW (ref 8.9–10.3)
Glucose, Bld: 83 mg/dL (ref 65–99)
POTASSIUM: 4.2 mmol/L (ref 3.5–5.1)
SODIUM: 128 mmol/L — AB (ref 135–145)

## 2017-01-06 LAB — CBC
HEMATOCRIT: 38.9 % — AB (ref 39.0–52.0)
Hemoglobin: 13.6 g/dL (ref 13.0–17.0)
MCH: 34.2 pg — ABNORMAL HIGH (ref 26.0–34.0)
MCHC: 35 g/dL (ref 30.0–36.0)
MCV: 97.7 fL (ref 78.0–100.0)
Platelets: 202 10*3/uL (ref 150–400)
RBC: 3.98 MIL/uL — AB (ref 4.22–5.81)
RDW: 12.5 % (ref 11.5–15.5)
WBC: 5.9 10*3/uL (ref 4.0–10.5)

## 2017-01-06 NOTE — Progress Notes (Signed)
BP 136/82 (BP Location: Left Arm, Patient Position: Sitting, Cuff Size: Normal)   Pulse 74   Temp 97.7 F (36.5 C)   Ht 5' 11.5" (1.816 m)   Wt 148 lb 4 oz (67.2 kg)   SpO2 99%   BMI 20.39 kg/m    Subjective:    Patient ID: Travis Weeks, male    DOB: [birth year], 51 y.o.   MRN: [id number]  HPI: Travis Weeks is a 51 y.o. male presenting on 01/06/2017 for Hypertension   HPI    pt still needs to take a letter to the accountant at the hospital to get [employer] discount application- referral to ortho in [place].  Pt has appt today with GI for + iFOBT.   Pt using 1 or 2 goody powders daily.    He is drinking still.  He drinks beer.  He still drinks at least one or two daily.   He has gained more weight.  He is up to 148 and BMI of 20.    Relevant past medical, surgical, family and social history reviewed and updated as indicated. Interim medical history since our last visit reviewed. Allergies and medications reviewed and updated.   Current Outpatient Prescriptions:  .  Aspirin-Acetaminophen-Caffeine (GOODY HEADACHE PO), Take by mouth., Disp: , Rfl:  .  famotidine (PEPCID) 20 MG tablet, Take 1 tablet (20 mg total) by mouth 2 (two) times daily., Disp: 60 tablet, Rfl: 3 .  lisinopril (PRINIVIL,ZESTRIL) 10 MG tablet, Take 1 tablet (10 mg total) by mouth daily., Disp: 30 tablet, Rfl: 1   Review of Systems  Constitutional: Negative for appetite change, chills, diaphoresis, fatigue, fever and unexpected weight change.  HENT: Positive for dental problem. Negative for congestion, drooling, ear pain, facial swelling, hearing loss, mouth sores, sneezing, sore throat, trouble swallowing and voice change.   Eyes: Negative for pain, discharge, redness, itching and visual disturbance.  Respiratory: Negative for cough, choking, shortness of breath and wheezing.   Cardiovascular: Negative for chest pain, palpitations and leg swelling.  Gastrointestinal: Positive for blood in stool. Negative for  abdominal pain, constipation, diarrhea and vomiting.  Endocrine: Negative for cold intolerance, heat intolerance and polydipsia.  Genitourinary: Negative for decreased urine volume, dysuria and hematuria.  Musculoskeletal: Positive for arthralgias. Negative for back pain and gait problem.  Skin: Negative for rash.  Allergic/Immunologic: Negative for environmental allergies.  Neurological: Negative for seizures, syncope, light-headedness and headaches.  Hematological: Negative for adenopathy.  Psychiatric/Behavioral: Negative for agitation, dysphoric mood and suicidal ideas. The patient is not nervous/anxious.     Per HPI unless specifically indicated above     Objective:    BP 136/82 (BP Location: Left Arm, Patient Position: Sitting, Cuff Size: Normal)   Pulse 74   Temp 97.7 F (36.5 C)   Ht 5' 11.5" (1.816 m)   Wt 148 lb 4 oz (67.2 kg)   SpO2 99%   BMI 20.39 kg/m   Wt Readings from Last 3 Encounters:  01/06/17 148 lb 4 oz (67.2 kg)  12/03/16 145 lb 8 oz (66 kg)  10/29/16 144 lb (65.3 kg)    Physical Exam  Constitutional: He is oriented to person, place, and time. He appears well-developed and well-nourished.  HENT:  Head: Normocephalic and atraumatic.  Neck: Neck supple.  Cardiovascular: Normal rate and regular rhythm.   Pulmonary/Chest: Effort normal and breath sounds normal. He has no wheezes.  Abdominal: Soft. Bowel sounds are normal. There is no hepatosplenomegaly. There is no tenderness.  Musculoskeletal: He exhibits  no edema.  Lymphadenopathy:    He has no cervical adenopathy.  Neurological: He is alert and oriented to person, place, and time.  Skin: Skin is warm and dry.  Psychiatric: He has a normal mood and affect. His behavior is normal.  Vitals reviewed.        Assessment & Plan:    Encounter Diagnoses  Name Primary?  . Essential hypertension Yes  . Hyponatremia   . Alcohol abuse   . Cigarette nicotine dependence with nicotine-induced disorder    . Occult blood positive stool     -Check bmp and cbc today -bp improved.  Still a bit high. No changes today -Counseled pt that Goody powders bad for stomach and he should only use if needed and try to avoid taking more than once or twice/week. -pt to go to GI as scheduled -pt to work on [employer] application -follow up 1 month...  RTO sooner prn

## 2017-01-06 NOTE — Progress Notes (Signed)
cc'ed to pcp °

## 2017-01-06 NOTE — Patient Instructions (Signed)
1. Please have your labs done. We will contact you with results as available.  2. Colonoscopy and upper endoscopy as scheduled. See separate instructions.  3. Aspirin powders can really mess up your stomach. Would limit as much as possible. Ibuprofen 400mg  one to two times daily may be effective for your pain, but again limit as much as possible because it too can irritate the stomach.

## 2017-01-06 NOTE — Progress Notes (Signed)
Primary Care Physician:  Jacquelin Hawking, PA-C  Primary Gastroenterologist:  Roetta Sessions, MD   Chief Complaint  Patient presents with  . Blood In Stools    HPI:  Travis Weeks is a 51 y.o. male here for further evaluation of heme positive stool and abdominal pain at request of Jacquelin Hawking, PA-C.   Patient gives remote history of a "hole in my stomach". Found at time of EGD. No surgery needed. Was awake for the procedure. Was on PPI for several years but got tired of paying for it. currently bowels are okay, depends on what eats. Stools black a few times. Last time about a week ago. No brbpr. Has had some intermittent epigastric pain. Applying pressure would help. Eating helps pain. Broke ribs on the left, couldn't eat, had lost a lot of weight. Weight coming back up now per patient. Weight up from 139 pounds in 08/2016 to 148 pounds now.  Stomach still flaring up. May last for several days.  A lot of heartburn, taking pepcid all the time, helps some. No dysphagia.    2-3 Goody Headaches daily for pain.   Mildly elevated AST in 08/2016. Multiple risk factors for Hep B/C. Will screen. Prior history of tattoos, intranasal drug use.  Current Outpatient Prescriptions  Medication Sig Dispense Refill  . Aspirin-Acetaminophen-Caffeine (GOODY HEADACHE PO) Take by mouth as needed.     . diphenhydrAMINE (BENADRYL) 25 mg capsule Take 25 mg by mouth every 6 (six) hours as needed.    . famotidine (PEPCID) 20 MG tablet Take 1 tablet (20 mg total) by mouth 2 (two) times daily. 60 tablet 3  . lisinopril (PRINIVIL,ZESTRIL) 10 MG tablet Take 1 tablet (10 mg total) by mouth daily. 30 tablet 1   No current facility-administered medications for this visit.     Allergies as of 01/06/2017  . (No Known Allergies)    Past Medical History:  Diagnosis Date  . HTN (hypertension)     Past Surgical History:  Procedure Laterality Date  . FINGER SURGERY Left    middle finger of L hand. pt unable to move it     Family History  Problem Relation Age of Onset  . Hypertension Mother   . Diabetes Mother   . Colon cancer Neg Hx     Social History   Social History  . Marital status: Unknown    Spouse name: N/A  . Number of children: N/A  . Years of education: N/A   Occupational History  . metal worker    Social History Main Topics  . Smoking status: Current Every Day Smoker    Packs/day: 1.00    Years: 39.00    Types: Cigarettes  . Smokeless tobacco: Never Used  . Alcohol use 1.2 - 1.8 oz/week    2 - 3 Cans of beer per week     Comment: several days per week (01/06/17)  . Drug use: No     Comment: UDS positive for cocaine 08/2016. No drugs in several months (as of 12/2016).  . Sexual activity: Not Currently   Other Topics Concern  . Not on file   Social History Narrative  . No narrative on file      ROS:  General: Negative for anorexia, weight loss, fever, chills, fatigue, weakness. Eyes: Negative for vision changes.  ENT: Negative for hoarseness, difficulty swallowing , nasal congestion. CV: Negative for chest pain, angina, palpitations, dyspnea on exertion, peripheral edema.  Respiratory: Negative for dyspnea at rest, dyspnea on exertion,  cough, sputum, wheezing.  GI: See history of present illness. GU:  Negative for dysuria, hematuria, urinary incontinence, urinary frequency, nocturnal urination.  MS: chronic for joint pain, no low back pain.  Derm: Negative for rash or itching.  Neuro: Negative for weakness, abnormal sensation, seizure, frequent headaches, memory loss, confusion.  Psych: Negative for anxiety, depression, suicidal ideation, hallucinations.  Endo: Negative for unusual weight change.  Heme: Negative for bruising or bleeding. Allergy: Negative for rash or hives.    Physical Examination:  BP (!) 142/87   Pulse 74   Temp (!) 96.7 F (35.9 C) (Oral)   Ht 5' 11" (1.803 m)   Wt 148 lb 9.6 oz (67.4 kg)   BMI 20.73 kg/m    General: Well-nourished,  well-developed in no acute distress.  Head: Normocephalic, atraumatic.   Eyes: Conjunctiva pink, no icterus. Mouth: Oropharyngeal mucosa moist and pink , no lesions erythema or exudate. Neck: Supple without thyromegaly, masses, or lymphadenopathy.  Lungs: Clear to auscultation bilaterally.  Heart: Regular rate and rhythm, no murmurs rubs or gallops.  Abdomen: Bowel sounds are normal, nontender, nondistended, no hepatosplenomegaly or masses, no abdominal bruits or    hernia , no rebound or guarding.   Rectal: deferred Extremities: No lower extremity edema. No clubbing or deformities.  Neuro: Alert and oriented x 4 , grossly normal neurologically.  Skin: Warm and dry, no rash or jaundice.   Psych: Alert and cooperative, normal mood and affect.  Labs: Lab Results  Component Value Date   CREATININE 0.68 12/03/2016   BUN 5 (L) 12/03/2016   NA 132 (L) 12/03/2016   K 3.9 12/03/2016   CL 97 (L) 12/03/2016   CO2 26 12/03/2016   Lab Results  Component Value Date   WBC 8.2 09/05/2016   HGB 15.9 09/05/2016   HCT 44.6 09/05/2016   MCV 95.3 09/05/2016   PLT 270 09/05/2016   Lab Results  Component Value Date   ALT 32 09/04/2016   AST 48 (H) 09/04/2016   ALKPHOS 79 09/04/2016   BILITOT 0.3 09/04/2016   UDS: positive for cocaine 08/2016.   Imaging Studies: No results found.   Impression/plan:  51-year-old gentleman presenting for further evaluation of heme-positive stools, upper GI symptoms. He has intermittent epigastric pain,? Melena, reflux on H2 blocker in the setting of daily aspirin powder use. No prior colonoscopy. Patient reports history of ulcer in the past. Would recommend EGD and colonoscopy for further evaluation of upper GI symptoms and heme positive stool. Plan for deep sedation in the OR given history of failed conscious sedation, etoh use, and prior substance abuse.  I have discussed the risks, alternatives, benefits with regards to but not limited to the risk of reaction  to medication, bleeding, infection, perforation and the patient is agreeable to proceed. Written consent to be obtained.  Regarding elevated LFTs, screen for Hep B and C. PCP is checking CBC today. Will follow up on that.   Encouraged patient to limit ASA powder use. He complains of chronic pain. Advised we could not manage his chronic pain. Limited ibuprofen may be better option than ASA powder but can still cause GI irritation, etc.    

## 2017-01-07 LAB — HEPATITIS B SURFACE ANTIGEN: HEP B S AG: NEGATIVE

## 2017-01-07 LAB — HEPATITIS C ANTIBODY

## 2017-01-12 NOTE — Progress Notes (Signed)
Please let patient know his viral markers are negative for hep b and c. Procedures as planed.

## 2017-01-13 NOTE — Progress Notes (Signed)
Tried to call pt and Vm not set up. Mailing a letter of the results.

## 2017-01-22 NOTE — Patient Instructions (Signed)
Travis Weeks  01/22/2017     @   Your procedure is scheduled on  01/29/2017 .  Report to [name] at  1015 A.M.  Call this number if you have problems the morning of surgery:  [phone]   Remember:  Do not eat food or drink liquids after midnight.  Take these medicines the morning of surgery with A SIP OF WATER  Pepcid, lisinopril.   Do not wear jewelry, make-up or nail polish.  Do not wear lotions, powders, or perfumes, or deoderant.  Do not shave 48 hours prior to surgery.  Men may shave face and neck.  Do not bring valuables to the hospital.  [facility] is not responsible for any belongings or valuables.  Contacts, dentures or bridgework may not be worn into surgery.  Leave your suitcase in the car.  After surgery it may be brought to your room.  For patients admitted to the hospital, discharge time will be determined by your treatment team.  Patients discharged the day of surgery will not be allowed to drive home.   Name and phone number of your driver:   family Special instructions:  Follow the diet and prep instructions given to you by Dr [name] office.  Please read over the following fact sheets that you were given. Anesthesia Post-op Instructions and Care and Recovery After Surgery       Esophagogastroduodenoscopy Esophagogastroduodenoscopy (EGD) is a procedure to examine the lining of the esophagus, stomach, and first part of the small intestine (duodenum). This procedure is done to check for problems such as inflammation, bleeding, ulcers, or growths. During this procedure, a long, flexible, lighted tube with a camera attached (endoscope) is inserted down the throat. Tell a health care provider about:  Any allergies you have.  All medicines you are taking, including vitamins, herbs, eye drops, creams, and over-the-counter medicines.  Any problems you or family members have had with anesthetic medicines.  Any  blood disorders you have.  Any surgeries you have had.  Any medical conditions you have.  Whether you are pregnant or may be pregnant. What are the risks? Generally, this is a safe procedure. However, problems may occur, including:  Infection.  Bleeding.  A tear (perforation) in the esophagus, stomach, or duodenum.  Trouble breathing.  Excessive sweating.  Spasms of the larynx.  A slowed heartbeat.  Low blood pressure.  What happens before the procedure?  Follow instructions from your health care provider about eating or drinking restrictions.  Ask your health care provider about: ? Changing or stopping your regular medicines. This is especially important if you are taking diabetes medicines or blood thinners. ? Taking medicines such as aspirin and ibuprofen. These medicines can thin your blood. Do not take these medicines before your procedure if your health care provider instructs you not to.  Plan to have someone take you home after the procedure.  If you wear dentures, be ready to remove them before the procedure. What happens during the procedure?  To reduce your risk of infection, your health care team will wash or sanitize their hands.  An IV tube will be put in a vein in your hand or arm. You will get medicines and fluids through this tube.  You will be given one or more of the following: ? A medicine to help you relax (sedative). ? A medicine to numb the area (local anesthetic). This medicine may  be sprayed into your throat. It will make you feel more comfortable and keep you from gagging or coughing during the procedure. ? A medicine for pain.  A mouth guard may be placed in your mouth to protect your teeth and to keep you from biting on the endoscope.  You will be asked to lie on your left side.  The endoscope will be lowered down your throat into your esophagus, stomach, and duodenum.  Air will be put into the endoscope. This will help your health  care provider see better.  The lining of your esophagus, stomach, and duodenum will be examined.  Your health care provider may: ? Take a tissue sample so it can be looked at in a lab (biopsy). ? Remove growths. ? Remove objects (foreign bodies) that are stuck. ? Treat any bleeding with medicines or other devices that stop tissue from bleeding. ? Widen (dilate) or stretch narrowed areas of your esophagus and stomach.  The endoscope will be taken out. The procedure may vary among health care providers and hospitals. What happens after the procedure?  Your blood pressure, heart rate, breathing rate, and blood oxygen level will be monitored often until the medicines you were given have worn off.  Do not eat or drink anything until the numbing medicine has worn off and your gag reflex has returned. This information is not intended to replace advice given to you by your health care provider. Make sure you discuss any questions you have with your health care provider. Document Released: 08/29/2004 Document Revised: 10/04/2015 Document Reviewed: 03/22/2015 Elsevier Interactive Patient Education  2018 [employer] Esophagogastroduodenoscopy, Care After Refer to this sheet in the next few weeks. These instructions provide you with information about caring for yourself after your procedure. Your health care provider may also give you more specific instructions. Your treatment has been planned according to current medical practices, but problems sometimes occur. Call your health care provider if you have any problems or questions after your procedure. What can I expect after the procedure? After the procedure, it is common to have:  A sore throat.  Nausea.  Bloating.  Dizziness.  Fatigue.  Follow these instructions at home:  Do not eat or drink anything until the numbing medicine (local anesthetic) has worn off and your gag reflex has returned. You will know that the local anesthetic has  worn off when you can swallow comfortably.  Do not drive for 24 hours if you received a medicine to help you relax (sedative).  If your health care provider took a tissue sample for testing during the procedure, make sure to get your test results. This is your responsibility. Ask your health care provider or the department performing the test when your results will be ready.  Keep all follow-up visits as told by your health care provider. This is important. Contact a health care provider if:  You cannot stop coughing.  You are not urinating.  You are urinating less than usual. Get help right away if:  You have trouble swallowing.  You cannot eat or drink.  You have throat or chest pain that gets worse.  You are dizzy or light-headed.  You faint.  You have nausea or vomiting.  You have chills.  You have a fever.  You have severe abdominal pain.  You have black, tarry, or bloody stools. This information is not intended to replace advice given to you by your health care provider. Make sure you discuss any questions you have with  your health care provider. Document Released: 04/14/2012 Document Revised: 10/04/2015 Document Reviewed: 03/22/2015 Elsevier Interactive Patient Education  2018 [employer].  Colonoscopy, Adult A colonoscopy is an exam to look at the entire large intestine. During the exam, a lubricated, bendable tube is inserted into the anus and then passed into the rectum, colon, and other parts of the large intestine. A colonoscopy is often done as a part of normal colorectal screening or in response to certain symptoms, such as anemia, persistent diarrhea, abdominal pain, and blood in the stool. The exam can help screen for and diagnose medical problems, including:  Tumors.  Polyps.  Inflammation.  Areas of bleeding.  Tell a health care provider about:  Any allergies you have.  All medicines you are taking, including vitamins, herbs, eye drops,  creams, and over-the-counter medicines.  Any problems you or family members have had with anesthetic medicines.  Any blood disorders you have.  Any surgeries you have had.  Any medical conditions you have.  Any problems you have had passing stool. What are the risks? Generally, this is a safe procedure. However, problems may occur, including:  Bleeding.  A tear in the intestine.  A reaction to medicines given during the exam.  Infection (rare).  What happens before the procedure? Eating and drinking restrictions Follow instructions from your health care provider about eating and drinking, which may include:  A few days before the procedure - follow a low-fiber diet. Avoid nuts, seeds, dried fruit, raw fruits, and vegetables.  1-3 days before the procedure - follow a clear liquid diet. Drink only clear liquids, such as clear broth or bouillon, black coffee or tea, clear juice, clear soft drinks or sports drinks, gelatin dessert, and popsicles. Avoid any liquids that contain red or purple dye.  On the day of the procedure - do not eat or drink anything during the 2 hours before the procedure, or within the time period that your health care provider recommends.  Bowel prep If you were prescribed an oral bowel prep to clean out your colon:  Take it as told by your health care provider. Starting the day before your procedure, you will need to drink a large amount of medicated liquid. The liquid will cause you to have multiple loose stools until your stool is almost clear or light green.  If your skin or anus gets irritated from diarrhea, you may use these to relieve the irritation: ? Medicated wipes, such as adult wet wipes with aloe and vitamin E. ? A skin soothing-product like petroleum jelly.  If you vomit while drinking the bowel prep, take a break for up to 60 minutes and then begin the bowel prep again. If vomiting continues and you cannot take the bowel prep without  vomiting, call your health care provider.  General instructions  Ask your health care provider about changing or stopping your regular medicines. This is especially important if you are taking diabetes medicines or blood thinners.  Plan to have someone take you home from the hospital or clinic. What happens during the procedure?  An IV tube may be inserted into one of your veins.  You will be given medicine to help you relax (sedative).  To reduce your risk of infection: ? Your health care team will wash or sanitize their hands. ? Your anal area will be washed with soap.  You will be asked to lie on your side with your knees bent.  Your health care provider will lubricate a long,  thin, flexible tube. The tube will have a camera and a light on the end.  The tube will be inserted into your anus.  The tube will be gently eased through your rectum and colon.  Air will be delivered into your colon to keep it open. You may feel some pressure or cramping.  The camera will be used to take images during the procedure.  A small tissue sample may be removed from your body to be examined under a microscope (biopsy). If any potential problems are found, the tissue will be sent to a lab for testing.  If small polyps are found, your health care provider may remove them and have them checked for cancer cells.  The tube that was inserted into your anus will be slowly removed. The procedure may vary among health care providers and hospitals. What happens after the procedure?  Your blood pressure, heart rate, breathing rate, and blood oxygen level will be monitored until the medicines you were given have worn off.  Do not drive for 24 hours after the exam.  You may have a small amount of blood in your stool.  You may pass gas and have mild abdominal cramping or bloating due to the air that was used to inflate your colon during the exam.  It is up to you to get the results of your procedure.  Ask your health care provider, or the department performing the procedure, when your results will be ready. This information is not intended to replace advice given to you by your health care provider. Make sure you discuss any questions you have with your health care provider. Document Released: 04/25/2000 Document Revised: 02/27/2016 Document Reviewed: 07/10/2015 Elsevier Interactive Patient Education  2018 [employer].  Colonoscopy, Adult, Care After This sheet gives you information about how to care for yourself after your procedure. Your health care provider may also give you more specific instructions. If you have problems or questions, contact your health care provider. What can I expect after the procedure? After the procedure, it is common to have:  A small amount of blood in your stool for 24 hours after the procedure.  Some gas.  Mild abdominal cramping or bloating.  Follow these instructions at home: General instructions   For the first 24 hours after the procedure: ? Do not drive or use machinery. ? Do not sign important documents. ? Do not drink alcohol. ? Do your regular daily activities at a slower pace than normal. ? Eat soft, easy-to-digest foods. ? Rest often.  Take over-the-counter or prescription medicines only as told by your health care provider.  It is up to you to get the results of your procedure. Ask your health care provider, or the department performing the procedure, when your results will be ready. Relieving cramping and bloating  Try walking around when you have cramps or feel bloated.  Apply heat to your abdomen as told by your health care provider. Use a heat source that your health care provider recommends, such as a moist heat pack or a heating pad. ? Place a towel between your skin and the heat source. ? Leave the heat on for 20-30 minutes. ? Remove the heat if your skin turns bright red. This is especially important if you are unable to  feel pain, heat, or cold. You may have a greater risk of getting burned. Eating and drinking  Drink enough fluid to keep your urine clear or pale yellow.  Resume your normal diet as  instructed by your health care provider. Avoid heavy or fried foods that are hard to digest.  Avoid drinking alcohol for as long as instructed by your health care provider. Contact a health care provider if:  You have blood in your stool 2-3 days after the procedure. Get help right away if:  You have more than a small spotting of blood in your stool.  You pass large blood clots in your stool.  Your abdomen is swollen.  You have nausea or vomiting.  You have a fever.  You have increasing abdominal pain that is not relieved with medicine. This information is not intended to replace advice given to you by your health care provider. Make sure you discuss any questions you have with your health care provider. Document Released: 12/11/2003 Document Revised: 01/21/2016 Document Reviewed: 07/10/2015 Elsevier Interactive Patient Education  2018 [employer] Inc.  Monitored Anesthesia Care Anesthesia is a term that refers to techniques, procedures, and medicines that help a person stay safe and comfortable during a medical procedure. Monitored anesthesia care, or sedation, is one type of anesthesia. Your anesthesia specialist may recommend sedation if you will be having a procedure that does not require you to be unconscious, such as:  Cataract surgery.  A dental procedure.  A biopsy.  A colonoscopy.  During the procedure, you may receive a medicine to help you relax (sedative). There are three levels of sedation:  Mild sedation. At this level, you may feel awake and relaxed. You will be able to follow directions.  Moderate sedation. At this level, you will be sleepy. You may not remember the procedure.  Deep sedation. At this level, you will be asleep. You will not remember the procedure.  The more  medicine you are given, the deeper your level of sedation will be. Depending on how you respond to the procedure, the anesthesia specialist may change your level of sedation or the type of anesthesia to fit your needs. An anesthesia specialist will monitor you closely during the procedure. Let your health care provider know about:  Any allergies you have.  All medicines you are taking, including vitamins, herbs, eye drops, creams, and over-the-counter medicines.  Any use of steroids (by mouth or as a cream).  Any problems you or family members have had with sedatives and anesthetic medicines.  Any blood disorders you have.  Any surgeries you have had.  Any medical conditions you have, such as sleep apnea.  Whether you are pregnant or may be pregnant.  Any use of cigarettes, alcohol, or street drugs. What are the risks? Generally, this is a safe procedure. However, problems may occur, including:  Getting too much medicine (oversedation).  Nausea.  Allergic reaction to medicines.  Trouble breathing. If this happens, a breathing tube may be used to help with breathing. It will be removed when you are awake and breathing on your own.  Heart trouble.  Lung trouble.  Before the procedure Staying hydrated Follow instructions from your health care provider about hydration, which may include:  Up to 2 hours before the procedure - you may continue to drink clear liquids, such as water, clear fruit juice, black coffee, and plain tea.  Eating and drinking restrictions Follow instructions from your health care provider about eating and drinking, which may include:  8 hours before the procedure - stop eating heavy meals or foods such as meat, fried foods, or fatty foods.  6 hours before the procedure - stop eating light meals or foods, such as  toast or cereal.  6 hours before the procedure - stop drinking milk or drinks that contain milk.  2 hours before the procedure - stop  drinking clear liquids.  Medicines Ask your health care provider about:  Changing or stopping your regular medicines. This is especially important if you are taking diabetes medicines or blood thinners.  Taking medicines such as aspirin and ibuprofen. These medicines can thin your blood. Do not take these medicines before your procedure if your health care provider instructs you not to.  Tests and exams  You will have a physical exam.  You may have blood tests done to show: ? How well your kidneys and liver are working. ? How well your blood can clot.  General instructions  Plan to have someone take you home from the hospital or clinic.  If you will be going home right after the procedure, plan to have someone with you for 24 hours.  What happens during the procedure?  Your blood pressure, heart rate, breathing, level of pain and overall condition will be monitored.  An IV tube will be inserted into one of your veins.  Your anesthesia specialist will give you medicines as needed to keep you comfortable during the procedure. This may mean changing the level of sedation.  The procedure will be performed. After the procedure  Your blood pressure, heart rate, breathing rate, and blood oxygen level will be monitored until the medicines you were given have worn off.  Do not drive for 24 hours if you received a sedative.  You may: ? Feel sleepy, clumsy, or nauseous. ? Feel forgetful about what happened after the procedure. ? Have a sore throat if you had a breathing tube during the procedure. ? Vomit. This information is not intended to replace advice given to you by your health care provider. Make sure you discuss any questions you have with your health care provider. Document Released: 01/22/2005 Document Revised: 10/05/2015 Document Reviewed: 08/19/2015 Elsevier Interactive Patient Education  2018 [employer] Monitored Anesthesia Care, Care After These instructions  provide you with information about caring for yourself after your procedure. Your health care provider may also give you more specific instructions. Your treatment has been planned according to current medical practices, but problems sometimes occur. Call your health care provider if you have any problems or questions after your procedure. What can I expect after the procedure? After your procedure, it is common to:  Feel sleepy for several hours.  Feel clumsy and have poor balance for several hours.  Feel forgetful about what happened after the procedure.  Have poor judgment for several hours.  Feel nauseous or vomit.  Have a sore throat if you had a breathing tube during the procedure.  Follow these instructions at home: For at least 24 hours after the procedure:   Do not: ? Participate in activities in which you could fall or become injured. ? Drive. ? Use heavy machinery. ? Drink alcohol. ? Take sleeping pills or medicines that cause drowsiness. ? Make important decisions or sign legal documents. ? Take care of children on your own.  Rest. Eating and drinking  Follow the diet that is recommended by your health care provider.  If you vomit, drink water, juice, or soup when you can drink without vomiting.  Make sure you have little or no nausea before eating solid foods. General instructions  Have a responsible adult stay with you until you are awake and alert.  Take over-the-counter and prescription medicines  only as told by your health care provider.  If you smoke, do not smoke without supervision.  Keep all follow-up visits as told by your health care provider. This is important. Contact a health care provider if:  You keep feeling nauseous or you keep vomiting.  You feel light-headed.  You develop a rash.  You have a fever. Get help right away if:  You have trouble breathing. This information is not intended to replace advice given to you by your health  care provider. Make sure you discuss any questions you have with your health care provider. Document Released: 08/19/2015 Document Revised: 12/19/2015 Document Reviewed: 08/19/2015 Elsevier Interactive Patient Education  [employer].

## 2017-01-27 ENCOUNTER — Encounter (HOSPITAL_COMMUNITY): Payer: Self-pay

## 2017-01-27 ENCOUNTER — Other Ambulatory Visit: Payer: Self-pay

## 2017-01-27 ENCOUNTER — Encounter (HOSPITAL_COMMUNITY)
Admission: RE | Admit: 2017-01-27 | Discharge: 2017-01-27 | Disposition: A | Discharge status: Home / Self Care | Payer: Self-pay | Source: Ambulatory Visit | Attending: Internal Medicine | Admitting: Internal Medicine

## 2017-01-27 ENCOUNTER — Encounter (HOSPITAL_COMMUNITY)
Admission: RE | Admit: 2017-01-27 | Discharge: 2017-01-27 | Disposition: A | Discharge status: Home / Self Care | Payer: Medicaid Other | Source: Ambulatory Visit | Attending: Internal Medicine | Admitting: Internal Medicine

## 2017-01-27 DIAGNOSIS — R1013 Epigastric pain: Secondary | ICD-10-CM | POA: Insufficient documentation

## 2017-01-27 DIAGNOSIS — Z01818 Encounter for other preprocedural examination: Secondary | ICD-10-CM | POA: Insufficient documentation

## 2017-01-27 DIAGNOSIS — K921 Melena: Secondary | ICD-10-CM | POA: Insufficient documentation

## 2017-01-27 DIAGNOSIS — R9431 Abnormal electrocardiogram [ECG] [EKG]: Secondary | ICD-10-CM | POA: Insufficient documentation

## 2017-01-27 HISTORY — DX: Gastro-esophageal reflux disease without esophagitis: K21.9

## 2017-01-27 HISTORY — DX: Gastric ulcer, unspecified as acute or chronic, without hemorrhage or perforation: K25.9

## 2017-01-29 ENCOUNTER — Ambulatory Visit (HOSPITAL_COMMUNITY)
Admission: RE | Admit: 2017-01-29 | Discharge: 2017-01-29 | Disposition: A | Discharge status: Home / Self Care | Payer: Self-pay | Source: Ambulatory Visit | Attending: Internal Medicine | Admitting: Internal Medicine

## 2017-01-29 ENCOUNTER — Ambulatory Visit (HOSPITAL_COMMUNITY): Payer: Self-pay | Admitting: Anesthesiology

## 2017-01-29 ENCOUNTER — Encounter (HOSPITAL_COMMUNITY): Payer: Self-pay | Admitting: *Deleted

## 2017-01-29 ENCOUNTER — Encounter (HOSPITAL_COMMUNITY)
Admission: RE | Disposition: A | Discharge status: Home / Self Care | Payer: Self-pay | Source: Ambulatory Visit | Attending: Internal Medicine

## 2017-01-29 DIAGNOSIS — Z791 Long term (current) use of non-steroidal anti-inflammatories (NSAID): Secondary | ICD-10-CM | POA: Insufficient documentation

## 2017-01-29 DIAGNOSIS — F17210 Nicotine dependence, cigarettes, uncomplicated: Secondary | ICD-10-CM | POA: Insufficient documentation

## 2017-01-29 DIAGNOSIS — K921 Melena: Secondary | ICD-10-CM

## 2017-01-29 DIAGNOSIS — K269 Duodenal ulcer, unspecified as acute or chronic, without hemorrhage or perforation: Secondary | ICD-10-CM | POA: Insufficient documentation

## 2017-01-29 DIAGNOSIS — R1013 Epigastric pain: Secondary | ICD-10-CM

## 2017-01-29 DIAGNOSIS — K5730 Diverticulosis of large intestine without perforation or abscess without bleeding: Secondary | ICD-10-CM | POA: Insufficient documentation

## 2017-01-29 DIAGNOSIS — K209 Esophagitis, unspecified: Secondary | ICD-10-CM

## 2017-01-29 DIAGNOSIS — K449 Diaphragmatic hernia without obstruction or gangrene: Secondary | ICD-10-CM | POA: Insufficient documentation

## 2017-01-29 DIAGNOSIS — K2210 Ulcer of esophagus without bleeding: Secondary | ICD-10-CM | POA: Insufficient documentation

## 2017-01-29 DIAGNOSIS — I10 Essential (primary) hypertension: Secondary | ICD-10-CM | POA: Insufficient documentation

## 2017-01-29 DIAGNOSIS — G8929 Other chronic pain: Secondary | ICD-10-CM | POA: Insufficient documentation

## 2017-01-29 DIAGNOSIS — R195 Other fecal abnormalities: Secondary | ICD-10-CM

## 2017-01-29 DIAGNOSIS — Z79899 Other long term (current) drug therapy: Secondary | ICD-10-CM | POA: Insufficient documentation

## 2017-01-29 DIAGNOSIS — K219 Gastro-esophageal reflux disease without esophagitis: Secondary | ICD-10-CM | POA: Insufficient documentation

## 2017-01-29 HISTORY — PX: ESOPHAGOGASTRODUODENOSCOPY (EGD) WITH PROPOFOL: SHX5813

## 2017-01-29 HISTORY — PX: COLONOSCOPY WITH PROPOFOL: SHX5780

## 2017-01-29 HISTORY — PX: BIOPSY: SHX5522

## 2017-01-29 LAB — RAPID URINE DRUG SCREEN, HOSP PERFORMED
Amphetamines: NOT DETECTED
BENZODIAZEPINES: NOT DETECTED
Barbiturates: NOT DETECTED
Cocaine: NOT DETECTED
OPIATES: NOT DETECTED
Tetrahydrocannabinol: NOT DETECTED

## 2017-01-29 SURGERY — COLONOSCOPY WITH PROPOFOL
Anesthesia: Monitor Anesthesia Care

## 2017-01-29 MED ORDER — CHLORHEXIDINE GLUCONATE CLOTH 2 % EX PADS: 6.0000 | MEDICATED_PAD | Freq: Once | CUTANEOUS | Status: DC

## 2017-01-29 MED ORDER — LACTATED RINGERS IV SOLN
INTRAVENOUS | Status: DC
  Administered 2017-01-29: 1000 mL via INTRAVENOUS

## 2017-01-29 MED ORDER — PROPOFOL 500 MG/50ML IV EMUL
INTRAVENOUS | Status: DC | PRN
  Administered 2017-01-29: 125 ug/kg/min via INTRAVENOUS
  Administered 2017-01-29 (×2): via INTRAVENOUS

## 2017-01-29 MED ORDER — LIDOCAINE VISCOUS 2 % MT SOLN
OROMUCOSAL | Status: DC | PRN
  Administered 2017-01-29: 3 mL via OROMUCOSAL

## 2017-01-29 MED ORDER — LIDOCAINE VISCOUS 2 % MT SOLN
OROMUCOSAL | Status: AC
  Filled 2017-01-29: qty 15

## 2017-01-29 MED ORDER — MIDAZOLAM HCL 2 MG/2ML IJ SOLN
INTRAMUSCULAR | Status: AC
  Filled 2017-01-29: qty 2

## 2017-01-29 MED ORDER — MIDAZOLAM HCL 5 MG/5ML IJ SOLN
INTRAMUSCULAR | Status: DC | PRN
  Administered 2017-01-29: 2 mg via INTRAVENOUS

## 2017-01-29 MED ORDER — MIDAZOLAM HCL 2 MG/2ML IJ SOLN
1.0000 mg | Freq: Once | INTRAMUSCULAR | Status: AC | PRN
  Administered 2017-01-29: 2 mg via INTRAVENOUS
  Filled 2017-01-29: qty 2

## 2017-01-29 MED ORDER — PROPOFOL 10 MG/ML IV BOLUS
INTRAVENOUS | Status: AC
  Filled 2017-01-29: qty 40

## 2017-01-29 MED ORDER — LIDOCAINE VISCOUS 2 % MT SOLN
3.0000 mL | OROMUCOSAL | Status: DC | PRN
  Administered 2017-01-29: 3 mL via OROMUCOSAL

## 2017-01-29 NOTE — Transfer of Care (Signed)
Immediate Anesthesia Transfer of Care Note  Patient: Travis Weeks  Procedure(s) Performed: Procedure(s) with comments: COLONOSCOPY WITH PROPOFOL (N/A) - 1215 ESOPHAGOGASTRODUODENOSCOPY (EGD) WITH PROPOFOL (N/A) BIOPSY - gastric  Patient Location: PACU  Anesthesia Type:MAC  Level of Consciousness: awake and patient cooperative  Airway & Oxygen Therapy: Patient Spontanous Breathing and Patient connected to face mask oxygen  Post-op Assessment: Report given to RN, Post -op Vital signs reviewed and stable and Patient moving all extremities  Post vital signs: Reviewed and stable  Last Vitals:  Vitals:   01/29/17 1112  BP: (!) 161/101  Resp: 18  Temp: (!) 36.4 C  SpO2: 99%    Last Pain:  Vitals:   01/29/17 1112  TempSrc: Oral      Patients Stated Pain Goal: 7 (85/27/78 2423)  Complications: No apparent anesthesia complications

## 2017-01-29 NOTE — Anesthesia Preprocedure Evaluation (Signed)
Anesthesia Evaluation  Patient identified by MRN, date of birth, ID band  Airway Mallampati: I  TM Distance: >3 FB Neck ROM: Full    Dental  (+) Partial Upper   Pulmonary Current Smoker,    Pulmonary exam normal        Cardiovascular Exercise Tolerance: Good hypertension, Pt. on medications  Rhythm:Regular Rate:Normal     Neuro/Psych PSYCHIATRIC DISORDERS    GI/Hepatic GERD  Medicated,  Endo/Other    Renal/GU      Musculoskeletal   Abdominal Normal abdominal exam  (+)   Peds  Hematology   Anesthesia Other Findings   Reproductive/Obstetrics                             Anesthesia Physical Anesthesia Plan  ASA: III  Anesthesia Plan: MAC   Post-op Pain Management:    Induction: Intravenous  PONV Risk Score and Plan:   Airway Management Planned: Simple Face Mask  Additional Equipment:   Intra-op Plan:   Post-operative Plan:   Informed Consent: I have reviewed the patients History and Physical, chart, labs and discussed the procedure including the risks, benefits and alternatives for the proposed anesthesia with the patient or authorized representative who has indicated his/her understanding and acceptance.     Plan Discussed with: CRNA  Anesthesia Plan Comments:         Anesthesia Quick Evaluation

## 2017-01-29 NOTE — Anesthesia Postprocedure Evaluation (Signed)
Anesthesia Post Note  Patient: Travis Weeks  Procedure(s) Performed: Procedure(s) (LRB): COLONOSCOPY WITH PROPOFOL (N/A) ESOPHAGOGASTRODUODENOSCOPY (EGD) WITH PROPOFOL (N/A) BIOPSY  Patient location during evaluation: PACU Anesthesia Type: MAC Level of consciousness: awake and patient cooperative Pain management: pain level controlled Vital Signs Assessment: post-procedure vital signs reviewed and stable Respiratory status: spontaneous breathing, nonlabored ventilation and respiratory function stable Cardiovascular status: blood pressure returned to baseline Postop Assessment: no apparent nausea or vomiting Anesthetic complications: no     Last Vitals:  Vitals:   01/29/17 1112  BP: (!) 161/101  Resp: 18  Temp: (!) 36.4 C  SpO2: 99%    Last Pain:  Vitals:   01/29/17 1112  TempSrc: Oral                 John Vasconcelos J

## 2017-01-29 NOTE — Interval H&P Note (Signed)
History and Physical Interval Note:  01/29/2017 1:09 PM  Travis Weeks  has presented today for surgery, with the diagnosis of BLOOD IN STOOL/EPGI PAIN  The various methods of treatment have been discussed with the patient and family. After consideration of risks, benefits and other options for treatment, the patient has consented to  Procedure(s) with comments: COLONOSCOPY WITH PROPOFOL (N/A) - 1215 ESOPHAGOGASTRODUODENOSCOPY (EGD) WITH PROPOFOL (N/A) as a surgical intervention .  The patient's history has been reviewed, patient examined, no change in status, stable for surgery.  I have reviewed the patient's chart and labs.  Questions were answered to the patient's satisfaction.     [name]

## 2017-01-29 NOTE — H&P (View-Only) (Signed)
Please let patient know his viral markers are negative for hep b and c. Procedures as planed.

## 2017-01-29 NOTE — H&P (View-Only) (Signed)
Primary Care Physician:  Jacquelin Hawking, PA-C  Primary Gastroenterologist:  Roetta Sessions, MD   Chief Complaint  Patient presents with  . Blood In Stools    HPI:  Travis Weeks is a 52 y.o. male here for further evaluation of heme positive stool and abdominal pain at request of Jacquelin Hawking, PA-C.   Patient gives remote history of a "hole in my stomach". Found at time of EGD. No surgery needed. Was awake for the procedure. Was on PPI for several years but got tired of paying for it. currently bowels are okay, depends on what eats. Stools black a few times. Last time about a week ago. No brbpr. Has had some intermittent epigastric pain. Applying pressure would help. Eating helps pain. Broke ribs on the left, couldn't eat, had lost a lot of weight. Weight coming back up now per patient. Weight up from 139 pounds in 08/2016 to 148 pounds now.  Stomach still flaring up. May last for several days.  A lot of heartburn, taking pepcid all the time, helps some. No dysphagia.    2-3 Goody Headaches daily for pain.   Mildly elevated AST in 08/2016. Multiple risk factors for Hep B/C. Will screen. Prior history of tattoos, intranasal drug use.  Current Outpatient Prescriptions  Medication Sig Dispense Refill  . Aspirin-Acetaminophen-Caffeine (GOODY HEADACHE PO) Take by mouth as needed.     . diphenhydrAMINE (BENADRYL) 25 mg capsule Take 25 mg by mouth every 6 (six) hours as needed.    . famotidine (PEPCID) 20 MG tablet Take 1 tablet (20 mg total) by mouth 2 (two) times daily. 60 tablet 3  . lisinopril (PRINIVIL,ZESTRIL) 10 MG tablet Take 1 tablet (10 mg total) by mouth daily. 30 tablet 1   No current facility-administered medications for this visit.     Allergies as of 01/06/2017  . (No Known Allergies)    Past Medical History:  Diagnosis Date  . HTN (hypertension)     Past Surgical History:  Procedure Laterality Date  . FINGER SURGERY Left    middle finger of L hand. pt unable to move it     Family History  Problem Relation Age of Onset  . Hypertension Mother   . Diabetes Mother   . Colon cancer Neg Hx     Social History   Social History  . Marital status: Unknown    Spouse name: N/A  . Number of children: N/A  . Years of education: N/A   Occupational History  . metal worker    Social History Main Topics  . Smoking status: Current Every Day Smoker    Packs/day: 1.00    Years: 39.00    Types: Cigarettes  . Smokeless tobacco: Never Used  . Alcohol use 1.2 - 1.8 oz/week    2 - 3 Cans of beer per week     Comment: several days per week (01/06/17)  . Drug use: No     Comment: UDS positive for cocaine 08/2016. No drugs in several months (as of 12/2016).  . Sexual activity: Not Currently   Other Topics Concern  . Not on file   Social History Narrative  . No narrative on file      ROS:  General: Negative for anorexia, weight loss, fever, chills, fatigue, weakness. Eyes: Negative for vision changes.  ENT: Negative for hoarseness, difficulty swallowing , nasal congestion. CV: Negative for chest pain, angina, palpitations, dyspnea on exertion, peripheral edema.  Respiratory: Negative for dyspnea at rest, dyspnea on exertion,  cough, sputum, wheezing.  GI: See history of present illness. GU:  Negative for dysuria, hematuria, urinary incontinence, urinary frequency, nocturnal urination.  MS: chronic for joint pain, no low back pain.  Derm: Negative for rash or itching.  Neuro: Negative for weakness, abnormal sensation, seizure, frequent headaches, memory loss, confusion.  Psych: Negative for anxiety, depression, suicidal ideation, hallucinations.  Endo: Negative for unusual weight change.  Heme: Negative for bruising or bleeding. Allergy: Negative for rash or hives.    Physical Examination:  BP (!) 142/87   Pulse 74   Temp (!) 96.7 F (35.9 C) (Oral)   Ht  (1.803 m)   Wt 148 lb 9.6 oz (67.4 kg)   BMI 20.73 kg/m    General: Well-nourished,  well-developed in no acute distress.  Head: Normocephalic, atraumatic.   Eyes: Conjunctiva pink, no icterus. Mouth: Oropharyngeal mucosa moist and pink , no lesions erythema or exudate. Neck: Supple without thyromegaly, masses, or lymphadenopathy.  Lungs: Clear to auscultation bilaterally.  Heart: Regular rate and rhythm, no murmurs rubs or gallops.  Abdomen: Bowel sounds are normal, nontender, nondistended, no hepatosplenomegaly or masses, no abdominal bruits or    hernia , no rebound or guarding.   Rectal: deferred Extremities: No lower extremity edema. No clubbing or deformities.  Neuro: Alert and oriented x 4 , grossly normal neurologically.  Skin: Warm and dry, no rash or jaundice.   Psych: Alert and cooperative, normal mood and affect.  Labs: Lab Results  Component Value Date   CREATININE 0.68 12/03/2016   BUN 5 (L) 12/03/2016   NA 132 (L) 12/03/2016   K 3.9 12/03/2016   CL 97 (L) 12/03/2016   CO2 26 12/03/2016   Lab Results  Component Value Date   WBC 8.2 09/05/2016   HGB 15.9 09/05/2016   HCT 44.6 09/05/2016   MCV 95.3 09/05/2016   PLT 270 09/05/2016   Lab Results  Component Value Date   ALT 32 09/04/2016   AST 48 (H) 09/04/2016   ALKPHOS 79 09/04/2016   BILITOT 0.3 09/04/2016   UDS: positive for cocaine 08/2016.   Imaging Studies: No results found.   Impression/plan:  51 year old gentleman presenting for further evaluation of heme-positive stools, upper GI symptoms. He has intermittent epigastric pain,? Melena, reflux on H2 blocker in the setting of daily aspirin powder use. No prior colonoscopy. Patient reports history of ulcer in the past. Would recommend EGD and colonoscopy for further evaluation of upper GI symptoms and heme positive stool. Plan for deep sedation in the OR given history of failed conscious sedation, etoh use, and prior substance abuse.  I have discussed the risks, alternatives, benefits with regards to but not limited to the risk of reaction  to medication, bleeding, infection, perforation and the patient is agreeable to proceed. Written consent to be obtained.  Regarding elevated LFTs, screen for Hep B and C. PCP is checking CBC today. Will follow up on that.   Encouraged patient to limit ASA powder use. He complains of chronic pain. Advised we could not manage his chronic pain. Limited ibuprofen may be better option than ASA powder but can still cause GI irritation, etc.

## 2017-01-29 NOTE — Interval H&P Note (Signed)
History and Physical Interval Note:  01/29/2017 1:10 PM  Travis Weeks  has presented today for surgery, with the diagnosis of BLOOD IN STOOL/EPGI PAIN  The various methods of treatment have been discussed with the patient and family. After consideration of risks, benefits and other options for treatment, the patient has consented to  Procedure(s) with comments: COLONOSCOPY WITH PROPOFOL (N/A) - 1215 ESOPHAGOGASTRODUODENOSCOPY (EGD) WITH PROPOFOL (N/A) as a surgical intervention .  The patient's history has been reviewed, patient examined, no change in status, stable for surgery.  I have reviewed the patient's chart and labs.  Questions were answered to the patient's satisfaction.     [name]  Patient denies dysphagia. EGD and colonoscopy per plan.  The risks, benefits, limitations, imponderables and alternatives regarding both EGD and colonoscopy have been reviewed with the patient. Questions have been answered. All parties agreeable.

## 2017-01-29 NOTE — Discharge Instructions (Addendum)

## 2017-01-29 NOTE — Op Note (Signed)
[facility] Patient Name: Travis Weeks Procedure Date: 01/29/2017 1:44 PM MRN: [id number] Date of Birth: 05-Nov-1965 Attending MD: Gennette Pac , MD CSN: [id number] Age: 51 Admit Type: Outpatient Procedure:                Colonoscopy Indications:              Heme positive stool Providers:                Gennette Pac, MD, Loma Messing B. Patsy Lager, RN,                            Dyann Ruddle Referring MD:              Medicines:                Propofol per Anesthesia Complications:            No immediate complications. Estimated Blood Loss:     Estimated blood loss was minimal. Procedure:                Pre-Anesthesia Assessment:                           - Prior to the procedure, a History and Physical                            was performed, and patient medications and                            allergies were reviewed. The patient's tolerance of                            previous anesthesia was also reviewed. The risks                            and benefits of the procedure and the sedation                            options and risks were discussed with the patient.                            All questions were answered, and informed consent                            was obtained. Prior Anticoagulants: The patient has                            taken no previous anticoagulant or antiplatelet                            agents. ASA Grade Assessment: III - A patient with                            severe systemic disease. After reviewing the risks  and benefits, the patient was deemed in                            satisfactory condition to undergo the procedure.                           After obtaining informed consent, the colonoscope                            was passed under direct vision. Throughout the                            procedure, the patient's blood pressure, pulse, and                            oxygen saturations were  monitored continuously. The                            [id number] ([id number]) scope was introduced through                            the and advanced to the the cecum, identified by                            appendiceal orifice and ileocecal valve. The                            ileocecal valve, appendiceal orifice, and rectum                            were photographed. The quality of the bowel                            preparation was adequate. Scope In: 1:48:01 PM Scope Out: 2:05:42 PM Scope Withdrawal Time: 0 hours 7 minutes 45 seconds  Total Procedure Duration: 0 hours 17 minutes 41 seconds  Findings:      The perianal and digital rectal examinations were normal.      Scattered small and large-mouthed diverticula were found in the sigmoid       colon and descending colon.      The exam was otherwise without abnormality on direct and retroflexion       views. Impression:               - Diverticulosis in the sigmoid colon and in the                            descending colon.                           - The examination was otherwise normal on direct                            and retroflexion views.                           -  No specimens collected. Moderate Sedation:      Moderate (conscious) sedation was personally administered by an       anesthesia professional. The following parameters were monitored: oxygen       saturation, heart rate, blood pressure, and response to care. Total       physician intraservice time was 27 minutes. Recommendation:           - Patient has a contact number available for                            emergencies. The signs and symptoms of potential                            delayed complications were discussed with the                            patient. Return to normal activities tomorrow.                            Written discharge instructions were provided to the                            patient.                           - Resume  previous diet.                           - Continue present medications.                           - Repeat colonoscopy in 10 years for screening                            purposes.                           - Return to GI clinic in 8 weeks. See EGD report. Procedure Code(s):        --- Professional ---                           [phone], Colonoscopy, flexible; diagnostic, including                            collection of specimen(s) by brushing or washing,                            when performed (separate procedure) Diagnosis Code(s):        --- Professional ---                           R19.5, Other fecal abnormalities                           K57.30, Diverticulosis of large intestine without  perforation or abscess without bleeding CPT copyright 2016 American Medical Association. All rights reserved. The codes documented in this report are preliminary and upon coder review may  be revised to meet current compliance requirements. Gerrit Friends. Thersa Mohiuddin, MD Gennette Pac, MD 01/29/2017 2:18:11 PM This report has been signed electronically. Number of Addenda: 0

## 2017-01-29 NOTE — Op Note (Signed)
[facility] Patient Name: Travis Weeks Procedure Date: 01/29/2017 12:09 PM MRN: [id number] Date of Birth: 1965-08-24 Attending MD: Gennette Pac , MD CSN: [id number] Age: 51 Admit Type: Outpatient Procedure:                Upper GI endoscopy Indications:              Epigastric abdominal pain Providers:                Gennette Pac, MD, Loma Messing B. Patsy Lager, RN,                            Dyann Ruddle Referring MD:              Medicines:                Propofol per Anesthesia Complications:            No immediate complications. Estimated Blood Loss:     Estimated blood loss was minimal. Procedure:                Pre-Anesthesia Assessment:                           - Prior to the procedure, a History and Physical                            was performed, and patient medications and                            allergies were reviewed. The patient's tolerance of                            previous anesthesia was also reviewed. The risks                            and benefits of the procedure and the sedation                            options and risks were discussed with the patient.                            All questions were answered, and informed consent                            was obtained. Prior Anticoagulants: The patient has                            taken no previous anticoagulant or antiplatelet                            agents. ASA Grade Assessment: III - A patient with                            severe systemic disease. After reviewing the risks  and benefits, the patient was deemed in                            satisfactory condition to undergo the procedure.                           After obtaining informed consent, the endoscope was                            passed under direct vision. Throughout the                            procedure, the patient's blood pressure, pulse, and                            oxygen  saturations were monitored continuously. The                            [id number] ([id number]) scope was introduced through the                            and advanced to the second part of duodenum. The                            upper GI endoscopy was accomplished without                            difficulty. The patient tolerated the procedure                            well. Scope In: 1:36:26 PM Scope Out: 1:42:42 PM Total Procedure Duration: 0 hours 6 minutes 16 seconds  Findings:      Distal esophageal erosions within 5 mm the GE junction. No Barrett's       epithelium seen. No esophageal varices.      A small hiatal hernia was present. scattered gastric erosions. No ulcer       or infiltrating process seen. This was biopsied with a cold forceps for       histology. Estimated blood loss was minimal.      Multiple erosions were found in the duodenal bulb. Impression:               - Erosive reflux Esophagitis.                           - Small hiatal hernia / gastric erosions. Biopsied.                           - Duodenal erosions. Moderate Sedation:      Moderate (conscious) sedation was personally administered by an       anesthesia professional. The following parameters were monitored: oxygen       saturation, heart rate, blood pressure, respiratory rate, EKG, adequacy       of pulmonary ventilation, and response to care. Total physician       intraservice time was 14 minutes. Recommendation:           -  Patient has a contact number available for                            emergencies. The signs and symptoms of potential                            delayed complications were discussed with the                            patient. Return to normal activities tomorrow.                            Written discharge instructions were provided to the                            patient.                           - Resume previous diet.                           - Patient has a contact  number available for                            emergencies. The signs and symptoms of potential                            delayed complications were discussed with the                            patient. Return to normal activities tomorrow.                            Written discharge instructions were provided to the                            patient.                           - Resume previous diet.                           - Continue present medications.                           - Await pathology results. Stop famotidine; begin                            Protonix 40 mg daily. Avoid aspirin/NSAID products.                            See colonoscopy report. Procedure Code(s):        --- Professional ---                           [phone], Esophagogastroduodenoscopy, flexible,  transoral; with biopsy, single or multiple Diagnosis Code(s):        --- Professional ---                           K20.9, Esophagitis, unspecified                           K44.9, Diaphragmatic hernia without obstruction or                            gangrene                           K26.9, Duodenal ulcer, unspecified as acute or                            chronic, without hemorrhage or perforation                           R10.13, Epigastric pain CPT copyright 2016 American Medical Association. All rights reserved. The codes documented in this report are preliminary and upon coder review may  be revised to meet current compliance requirements. Gerrit Friends. Maliyah Willets, MD Gennette Pac, MD 01/29/2017 2:13:42 PM This report has been signed electronically. Number of Addenda: 0

## 2017-02-02 ENCOUNTER — Encounter (HOSPITAL_COMMUNITY): Payer: Self-pay | Admitting: Internal Medicine

## 2017-02-03 ENCOUNTER — Encounter: Payer: Self-pay | Admitting: Internal Medicine

## 2017-02-05 ENCOUNTER — Ambulatory Visit: Payer: Medicaid Other | Admitting: Physician Assistant

## 2017-02-09 ENCOUNTER — Ambulatory Visit: Payer: Self-pay | Admitting: Physician Assistant

## 2017-02-09 ENCOUNTER — Encounter: Payer: Self-pay | Admitting: Physician Assistant

## 2017-02-09 ENCOUNTER — Other Ambulatory Visit (HOSPITAL_COMMUNITY)
Admission: RE | Admit: 2017-02-09 | Discharge: 2017-02-09 | Disposition: A | Discharge status: Home / Self Care | Payer: Self-pay | Source: Ambulatory Visit | Attending: Physician Assistant | Admitting: Physician Assistant

## 2017-02-09 VITALS — BP 144/76 | HR 80 | Temp 97.7°F | Ht 71.5 in | Wt 146.0 lb

## 2017-02-09 DIAGNOSIS — I10 Essential (primary) hypertension: Secondary | ICD-10-CM | POA: Insufficient documentation

## 2017-02-09 DIAGNOSIS — F17219 Nicotine dependence, cigarettes, with unspecified nicotine-induced disorders: Secondary | ICD-10-CM

## 2017-02-09 DIAGNOSIS — E871 Hypo-osmolality and hyponatremia: Secondary | ICD-10-CM

## 2017-02-09 DIAGNOSIS — F1010 Alcohol abuse, uncomplicated: Secondary | ICD-10-CM

## 2017-02-09 DIAGNOSIS — K2100 Gastro-esophageal reflux disease with esophagitis, without bleeding: Secondary | ICD-10-CM

## 2017-02-09 LAB — BASIC METABOLIC PANEL
ANION GAP: 11 (ref 5–15)
BUN: <5 mg/dL — ABNORMAL LOW (ref 6–20)
CALCIUM: 9.2 mg/dL (ref 8.9–10.3)
CHLORIDE: 94 mmol/L — AB (ref 101–111)
CO2: 22 mmol/L (ref 22–32)
Creatinine, Ser: 0.55 mg/dL — ABNORMAL LOW (ref 0.61–1.24)
GFR calc non Af Amer: >60 mL/min (ref >60)
Glucose, Bld: 92 mg/dL (ref 65–99)
Potassium: 4.3 mmol/L (ref 3.5–5.1)
SODIUM: 127 mmol/L — AB (ref 135–145)

## 2017-02-09 MED ORDER — LISINOPRIL 20 MG PO TABS: 20.0000 mg | ORAL_TABLET | Freq: Every day | ORAL | 3 refills | Status: DC

## 2017-02-09 NOTE — Progress Notes (Signed)
BP (!) 144/76 (BP Location: Left Arm, Patient Position: Sitting, Cuff Size: Normal)   Pulse 80   Temp 97.7 F (36.5 C)   Ht 5' 11.5" (1.816 m)   Wt 146 lb (66.2 kg)   SpO2 99%   BMI 20.08 kg/m    Subjective:    Patient ID: Travis Weeks, male    DOB: 27-May-1965, 51 y.o.   MRN: [id number]  HPI: Travis Weeks is a 51 y.o. male presenting on 02/09/2017 for Hypertension   HPI   Pt had colonoscopy which showed Diverticula and EGD which showed Erosive esophagitis. He was given rx protonix.  he did not get it filled yet  Pt did not get labs drawn yet   Pt says she is still Drinking 2-3 daily.  He continues to smoke.    Relevant past medical, surgical, family and social history reviewed and updated as indicated. Interim medical history since our last visit reviewed. Allergies and medications reviewed and updated.   Current Outpatient Prescriptions:  .  Aspirin-Acetaminophen-Caffeine (GOODY HEADACHE PO), Take by mouth as needed. , Disp: , Rfl:  .  diphenhydrAMINE (BENADRYL) 25 mg capsule, Take 25 mg by mouth every 6 (six) hours as needed., Disp: , Rfl:  .  famotidine (PEPCID) 20 MG tablet, Take 1 tablet (20 mg total) by mouth 2 (two) times daily., Disp: 60 tablet, Rfl: 3 .  lisinopril (PRINIVIL,ZESTRIL) 10 MG tablet, Take 1 tablet (10 mg total) by mouth daily. (Patient not taking: Reported on 02/09/2017), Disp: 30 tablet, Rfl: 1 .  pantoprazole (PROTONIX) 40 MG tablet, Take 40 mg by mouth daily., Disp: , Rfl:    Review of Systems  Constitutional: Negative for appetite change, chills, diaphoresis, fatigue, fever and unexpected weight change.  HENT: Positive for dental problem. Negative for congestion, drooling, ear pain, facial swelling, hearing loss, mouth sores, sneezing, sore throat, trouble swallowing and voice change.   Eyes: Negative for pain, discharge, redness, itching and visual disturbance.  Respiratory: Positive for choking. Negative for cough, shortness of breath and wheezing.    Cardiovascular: Negative for chest pain, palpitations and leg swelling.  Gastrointestinal: Positive for blood in stool. Negative for abdominal pain, constipation, diarrhea and vomiting.  Endocrine: Negative for cold intolerance, heat intolerance and polydipsia.  Genitourinary: Negative for decreased urine volume, dysuria and hematuria.  Musculoskeletal: Negative for arthralgias, back pain and gait problem.  Skin: Negative for rash.  Allergic/Immunologic: Negative for environmental allergies.  Neurological: Negative for seizures, syncope, light-headedness and headaches.  Hematological: Negative for adenopathy.  Psychiatric/Behavioral: Negative for agitation, dysphoric mood and suicidal ideas. The patient is not nervous/anxious.     Per HPI unless specifically indicated above     Objective:    BP (!) 144/76 (BP Location: Left Arm, Patient Position: Sitting, Cuff Size: Normal)   Pulse 80   Temp 97.7 F (36.5 C)   Ht 5' 11.5" (1.816 m)   Wt 146 lb (66.2 kg)   SpO2 99%   BMI 20.08 kg/m   Wt Readings from Last 3 Encounters:  02/09/17 146 lb (66.2 kg)  01/27/17 149 lb (67.6 kg)  01/06/17 148 lb 9.6 oz (67.4 kg)    Physical Exam  Constitutional: He is oriented to person, place, and time. He appears well-developed and well-nourished.  HENT:  Head: Normocephalic and atraumatic.  Neck: Neck supple.  Cardiovascular: Normal rate and regular rhythm.   Pulmonary/Chest: Effort normal and breath sounds normal. He has no wheezes.  Abdominal: Soft. Bowel sounds are normal. There is  no hepatosplenomegaly. There is no tenderness.  Musculoskeletal: He exhibits no edema.  Lymphadenopathy:    He has no cervical adenopathy.  Neurological: He is alert and oriented to person, place, and time.  Skin: Skin is warm and dry.  Psychiatric: He has a normal mood and affect. His behavior is normal.  Vitals reviewed.       Assessment & Plan:    Encounter Diagnoses  Name Primary?  . Essential  hypertension Yes  . Hyponatremia   . Alcohol abuse   . Cigarette nicotine dependence with nicotine-induced disorder   . Gastroesophageal reflux disease with esophagitis      -increase lisinopril to  qd -Gave coupon for protonix and encouraged him to start taking it today -pt to Get labs drawn when leaves office today -counseled pt again that he really needs to stop drinking alcohol for many reasons, mostly to get sodium controlled. -follow up 6 weeks to recheck bp.  RTO sooner prn

## 2017-03-23 ENCOUNTER — Ambulatory Visit: Payer: Self-pay | Admitting: Physician Assistant

## 2017-03-31 ENCOUNTER — Ambulatory Visit: Payer: Self-pay | Admitting: Gastroenterology

## 2017-05-19 ENCOUNTER — Ambulatory Visit: Payer: Self-pay | Admitting: Gastroenterology

## 2018-07-28 IMAGING — MR MR HIP*R* W/O CM
5 series · 35 of 40 positions shown · non-contrast
Comparison: 09/03/2016 plain radiographs

CLINICAL DATA: Right hip pain x4 weeks.

EXAM:
MR OF THE RIGHT HIP WITHOUT CONTRAST
TECHNIQUE: Multiplanar, multisequence MR imaging was performed. No intravenous
contrast was administered.

[Series 5: t1_ir_cor · coronal · 4.0mm · 1.09mm/px · 9 of 28 slices shown]
[im 1/28]
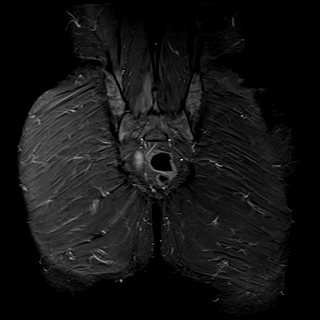
[im 4/28]
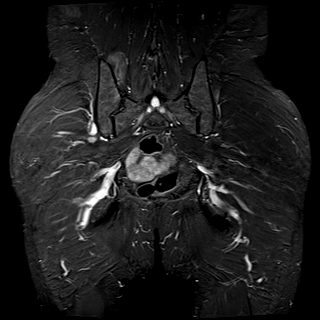
[im 7/28]
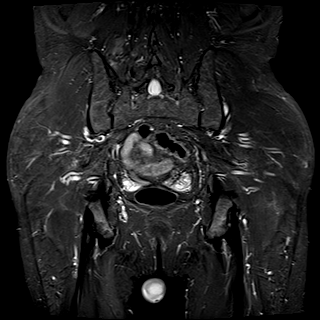
[im 11/28]
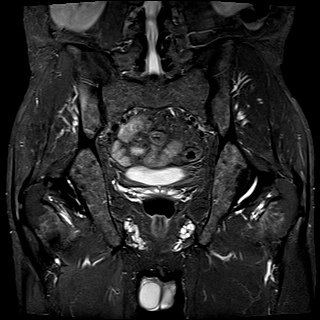
[im 14/28]
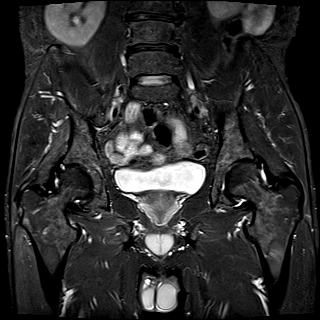
[im 17/28]
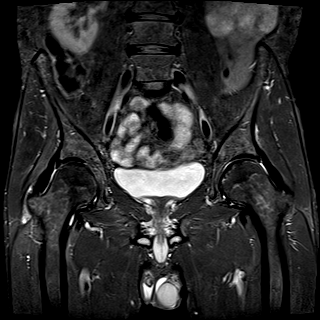
[im 21/28]
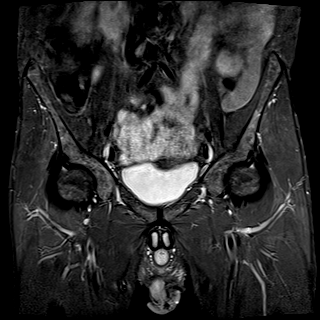
[im 24/28]
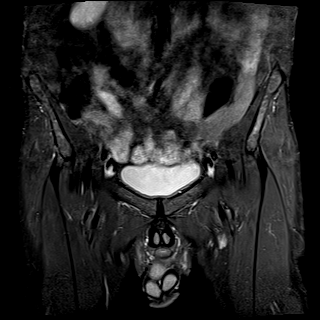
[im 28/28]
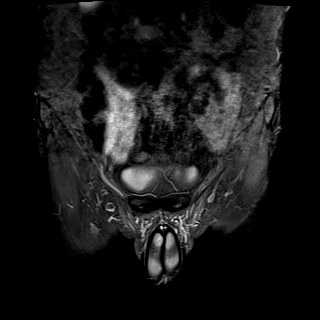

[Series 6: t1_tse_cor · coronal · 4.0mm · 0.78mm/px · 9 of 28 slices shown]
[im 1/28]
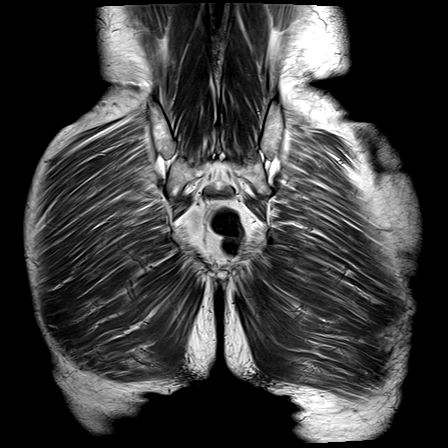
[im 4/28]
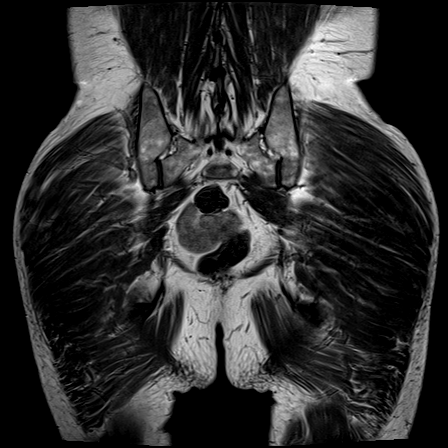
[im 7/28]
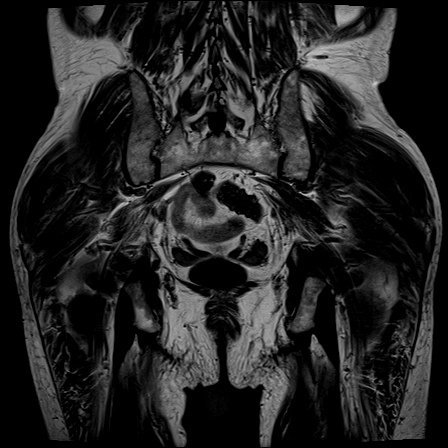
[im 11/28]
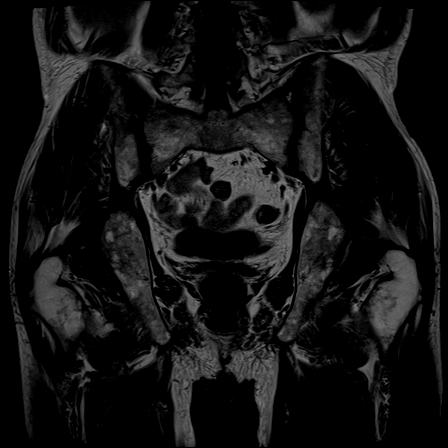
[im 14/28]
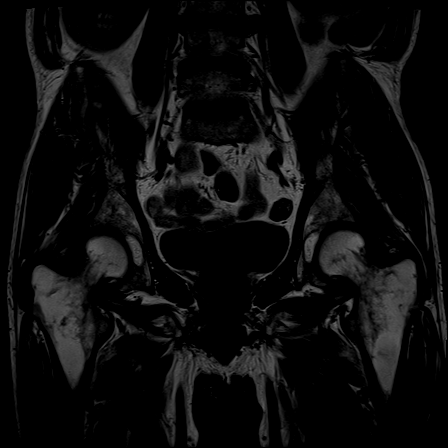
[im 17/28]
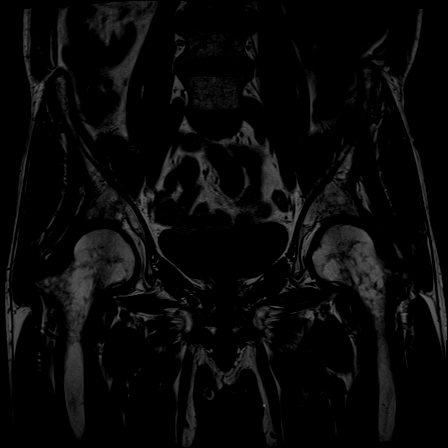
[im 21/28]
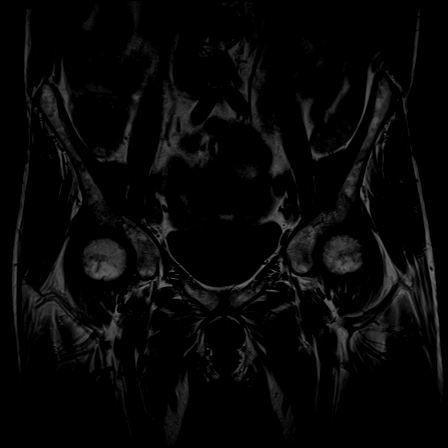
[im 24/28]
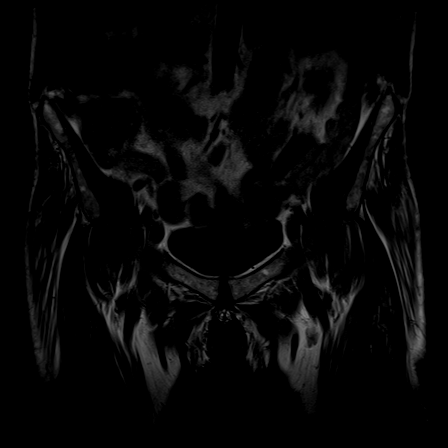
[im 28/28]
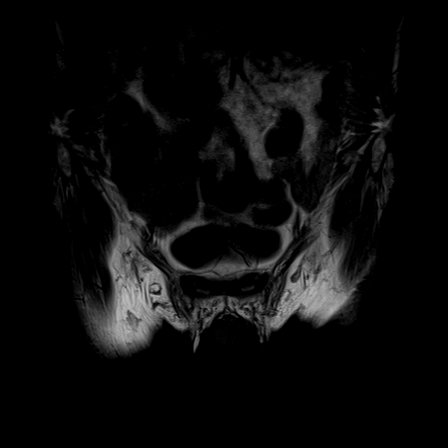

[Series 7: t2fs_tse_tra · axial · 4.0mm · 1.09mm/px · z∈[-86,+24]mm · 7 of 24 slices shown]
[im 1/24]
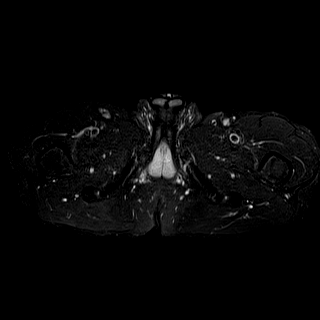
[im 4/24]
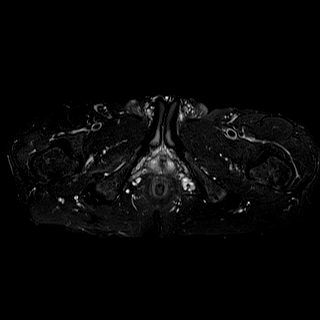
[im 8/24]
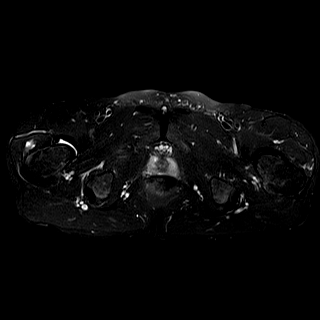
[im 12/24]
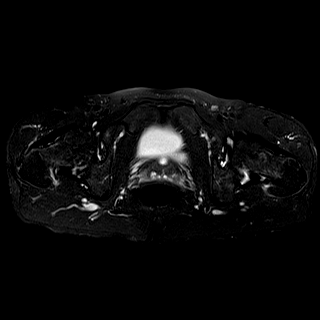
[im 16/24]
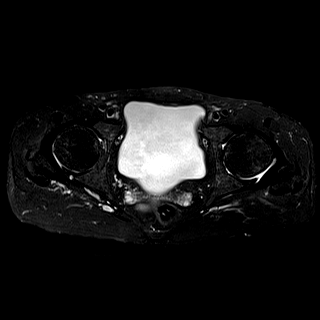
[im 20/24]
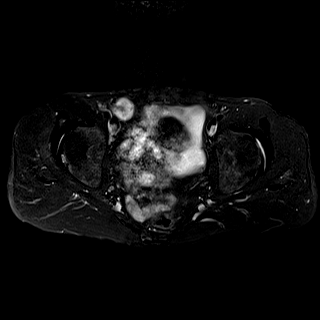
[im 24/24]
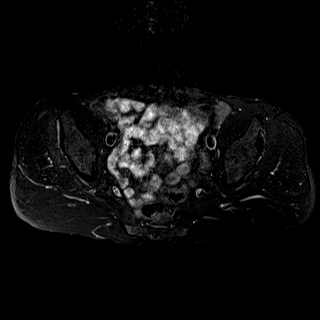

[Series 8: t1_tse_tra · axial · 4.0mm · 1.09mm/px · z∈[-86,+24]mm · 7 of 24 slices shown]
[im 1/24]
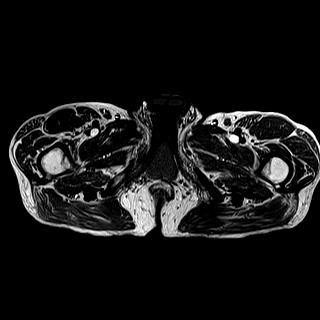
[im 4/24]
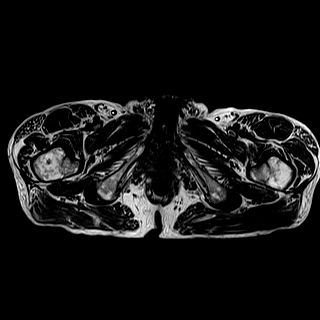
[im 8/24]
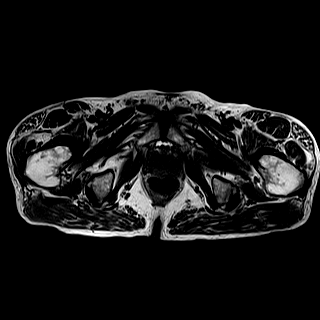
[im 12/24]
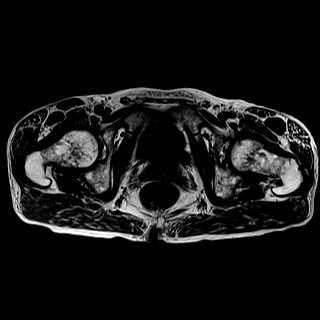
[im 16/24]
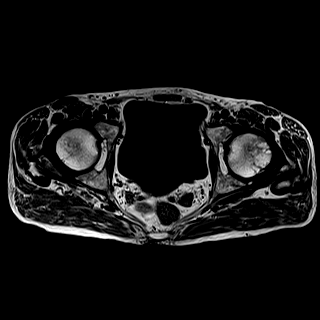
[im 20/24]
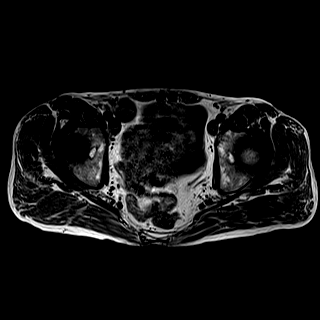
[im 24/24]
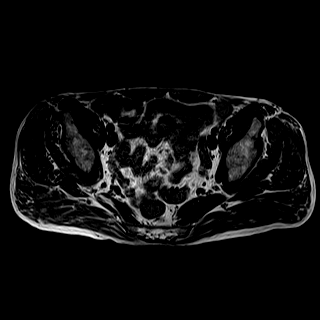

[Series 9: pd_tse_fs_sag · sagittal · 4.0mm · 0.27mm/px · 3 of 26 slices shown]
[im 1/26]
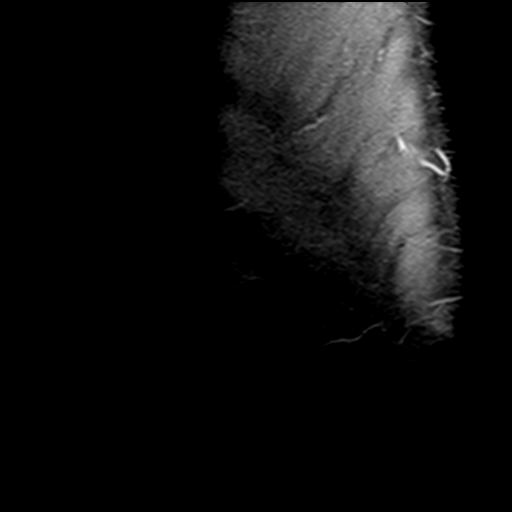
[im 4/26]
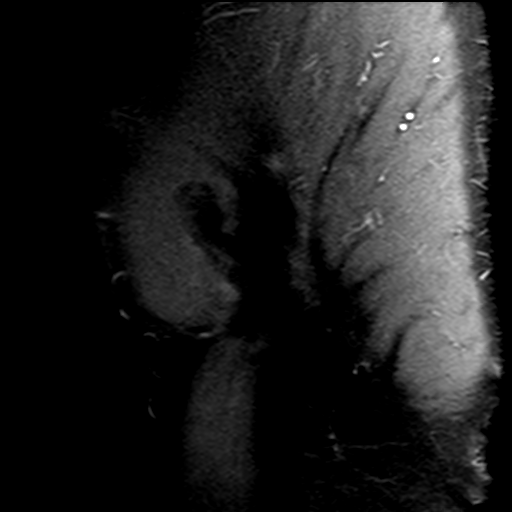
[im 8/26]
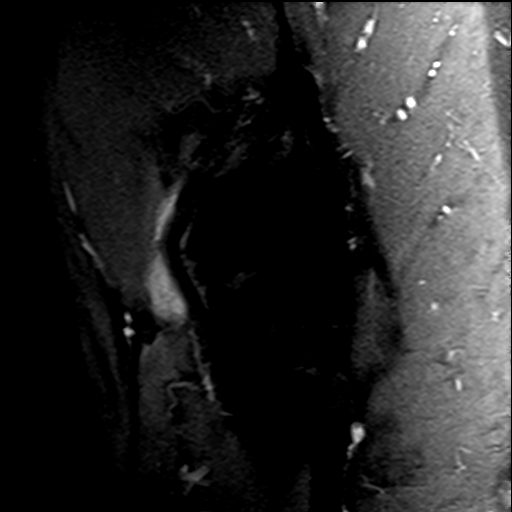

[35 of 40 positions shown; findings below may reference images not displayed]

FINDINGS: Bones: No acute fracture nor bone destruction. No focal bone marrow
edema.

Articular cartilage and labrum

Articular cartilage: No focal chondral defect or significant
chondral thinning.

Labrum: Small amount fluid in keeping with seen at the base of the
superior acetabulum on the right, series 5 image 15 suspicious for
labral tear.

Joint or bursal effusion

Joint effusion:  No significant joint effusion.

Bursae: Right greater trochanteric bursal fluid consistent
trochanteric bursitis.

Muscles and tendons

Muscles and tendons:  Intact.  No muscle atrophy or mass.

Other findings

Miscellaneous: The visualized lumbar spine is maintained without
significant disc desiccation. No retroperitoneal mass. The bladder
is unremarkable. The prostate is within normal limits for size.
Bowel loops demonstrate no acute inflammation or wall thickening.
IMPRESSION: 1. Right greater trochanteric bursitis.
2. Small amount of fluid undermines of the superior acetabular
labrum of the right hip suspicious for a labral tear.

## 2018-07-28 IMAGING — DX DG ORBITS FOR FOREIGN BODY
2 series · 2 of 2 positions shown · non-contrast
Comparison: None.

CLINICAL DATA: Metal working/exposure; clearance prior to MRI

EXAM:
ORBITS FOR FOREIGN BODY - 2 VIEW

[orbits pa (1 of 2)]
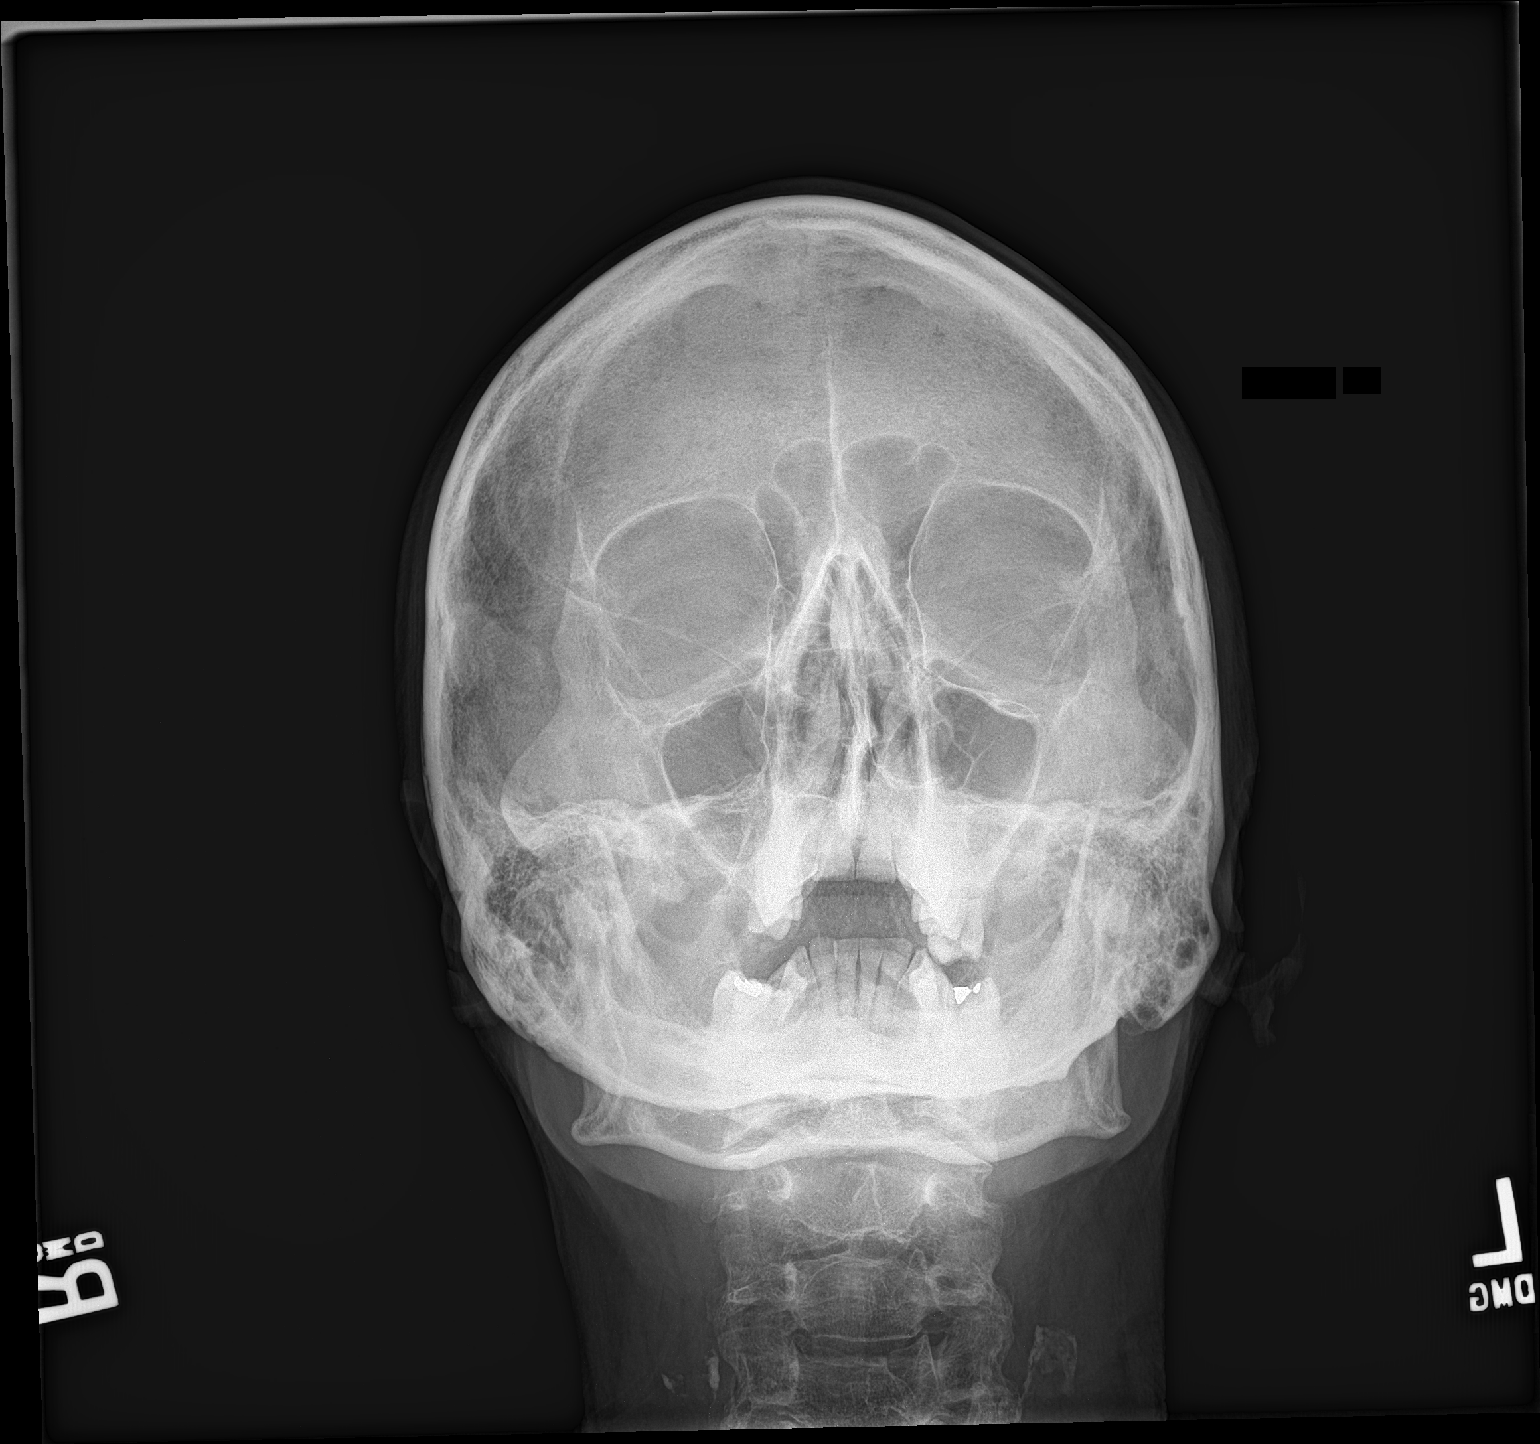

[orbits pa (2 of 2)]
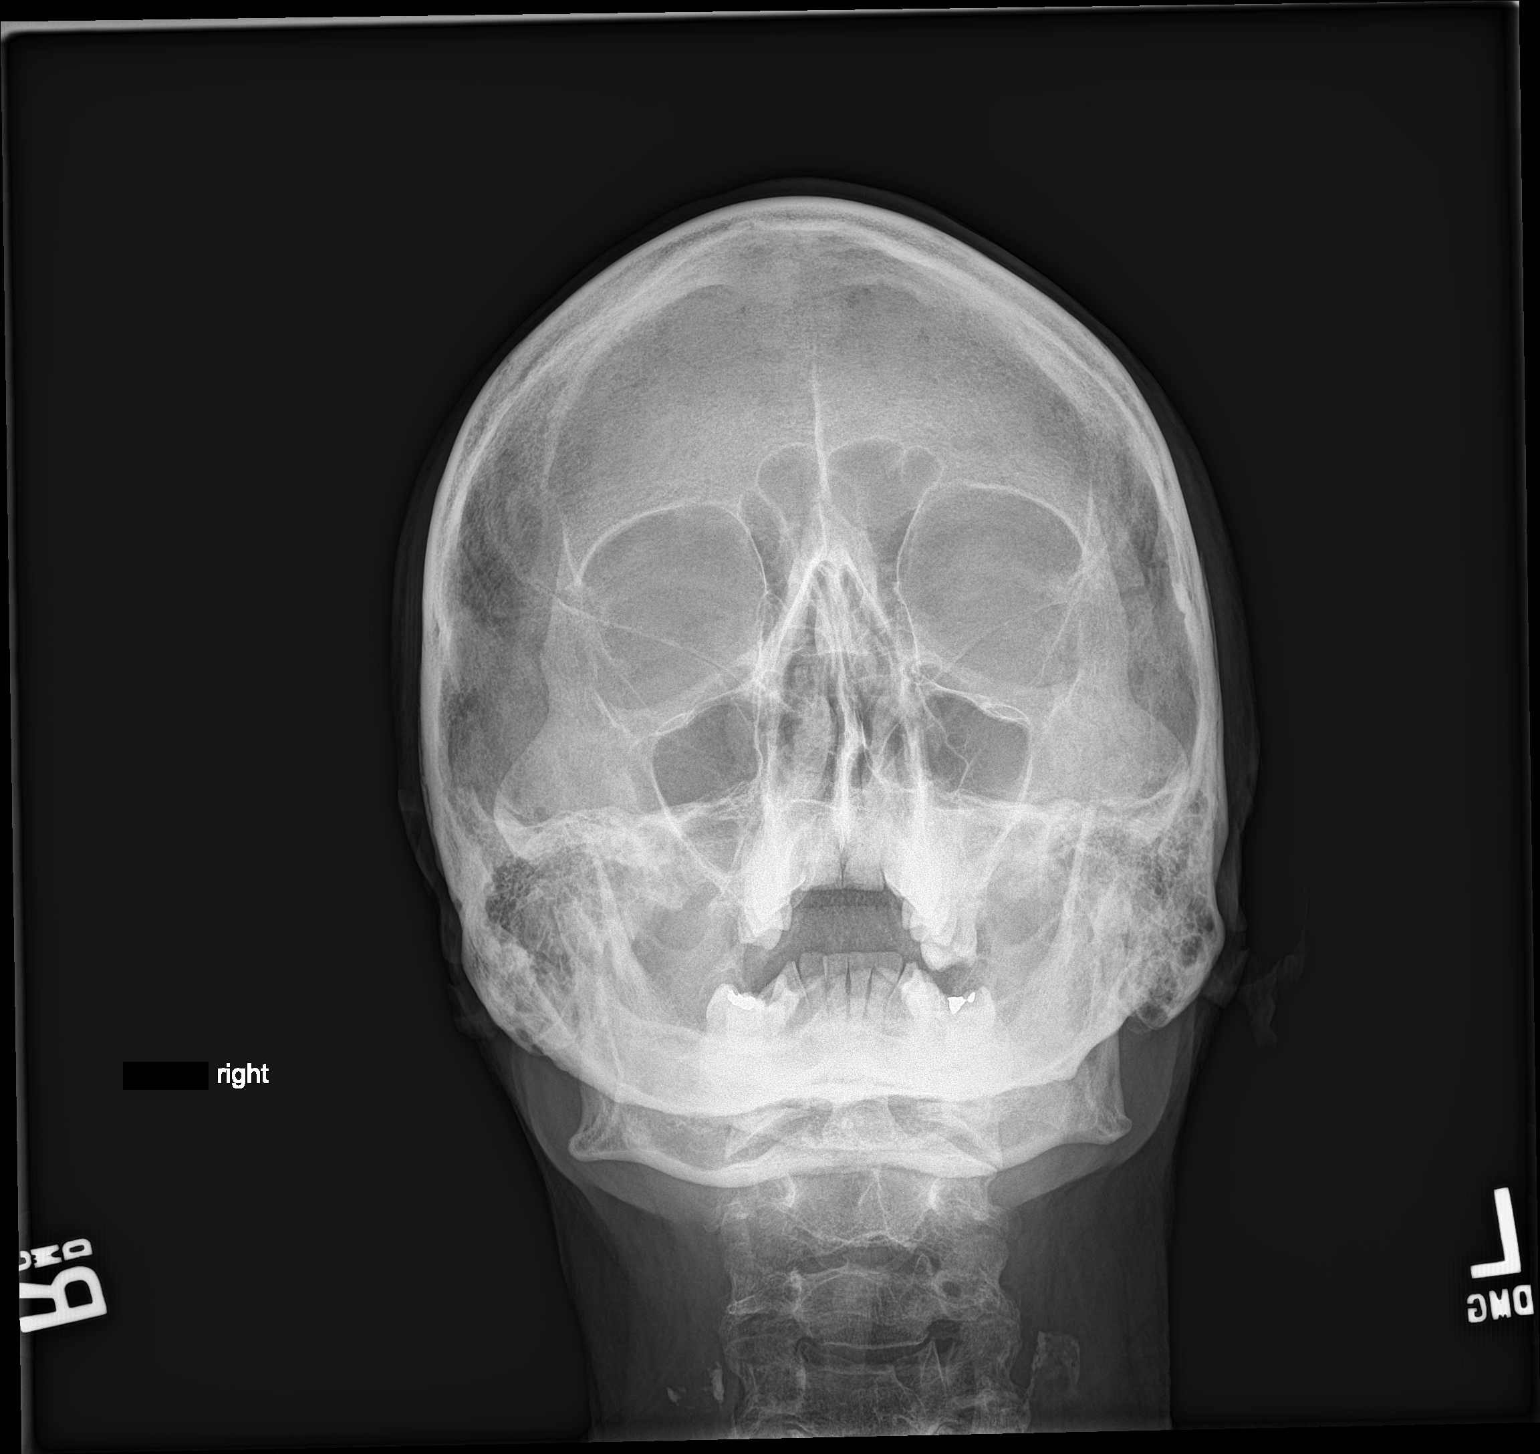

[2 of 2 positions shown; findings below may reference images not displayed]

FINDINGS: There is no evidence of metallic foreign body within the orbits. No
significant bone abnormality identified.
IMPRESSION: No evidence of metallic foreign body within the orbits.

## 2020-02-15 ENCOUNTER — Inpatient Hospital Stay (HOSPITAL_COMMUNITY): Payer: Self-pay

## 2020-02-15 ENCOUNTER — Inpatient Hospital Stay (HOSPITAL_COMMUNITY)
Admission: EM | Admit: 2020-02-15 | Discharge: 2020-02-18 | DRG: 641 | Disposition: A | Discharge status: Home / Self Care | Payer: Self-pay | Attending: Internal Medicine | Admitting: Internal Medicine

## 2020-02-15 ENCOUNTER — Encounter (HOSPITAL_COMMUNITY): Payer: Self-pay | Admitting: Emergency Medicine

## 2020-02-15 ENCOUNTER — Other Ambulatory Visit: Payer: Self-pay

## 2020-02-15 DIAGNOSIS — F10939 Alcohol use, unspecified with withdrawal, unspecified: Secondary | ICD-10-CM

## 2020-02-15 DIAGNOSIS — F10239 Alcohol dependence with withdrawal, unspecified: Secondary | ICD-10-CM

## 2020-02-15 DIAGNOSIS — Z66 Do not resuscitate: Secondary | ICD-10-CM | POA: Diagnosis present

## 2020-02-15 DIAGNOSIS — K2920 Alcoholic gastritis without bleeding: Secondary | ICD-10-CM | POA: Diagnosis present

## 2020-02-15 DIAGNOSIS — K529 Noninfective gastroenteritis and colitis, unspecified: Secondary | ICD-10-CM

## 2020-02-15 DIAGNOSIS — N179 Acute kidney failure, unspecified: Secondary | ICD-10-CM | POA: Diagnosis present

## 2020-02-15 DIAGNOSIS — E871 Hypo-osmolality and hyponatremia: Secondary | ICD-10-CM | POA: Diagnosis present

## 2020-02-15 DIAGNOSIS — K259 Gastric ulcer, unspecified as acute or chronic, without hemorrhage or perforation: Secondary | ICD-10-CM | POA: Diagnosis present

## 2020-02-15 DIAGNOSIS — R059 Cough, unspecified: Secondary | ICD-10-CM

## 2020-02-15 DIAGNOSIS — Z20822 Contact with and (suspected) exposure to covid-19: Secondary | ICD-10-CM | POA: Diagnosis present

## 2020-02-15 DIAGNOSIS — I10 Essential (primary) hypertension: Secondary | ICD-10-CM | POA: Diagnosis present

## 2020-02-15 DIAGNOSIS — Z79899 Other long term (current) drug therapy: Secondary | ICD-10-CM

## 2020-02-15 DIAGNOSIS — F10930 Alcohol use, unspecified with withdrawal, uncomplicated: Secondary | ICD-10-CM

## 2020-02-15 DIAGNOSIS — R1110 Vomiting, unspecified: Secondary | ICD-10-CM

## 2020-02-15 DIAGNOSIS — K760 Fatty (change of) liver, not elsewhere classified: Secondary | ICD-10-CM | POA: Diagnosis present

## 2020-02-15 DIAGNOSIS — F17219 Nicotine dependence, cigarettes, with unspecified nicotine-induced disorders: Secondary | ICD-10-CM | POA: Diagnosis present

## 2020-02-15 DIAGNOSIS — F10230 Alcohol dependence with withdrawal, uncomplicated: Secondary | ICD-10-CM | POA: Diagnosis present

## 2020-02-15 DIAGNOSIS — Z7141 Alcohol abuse counseling and surveillance of alcoholic: Secondary | ICD-10-CM

## 2020-02-15 DIAGNOSIS — D696 Thrombocytopenia, unspecified: Secondary | ICD-10-CM | POA: Diagnosis present

## 2020-02-15 DIAGNOSIS — D72829 Elevated white blood cell count, unspecified: Secondary | ICD-10-CM

## 2020-02-15 DIAGNOSIS — F17210 Nicotine dependence, cigarettes, uncomplicated: Secondary | ICD-10-CM | POA: Diagnosis present

## 2020-02-15 DIAGNOSIS — E860 Dehydration: Principal | ICD-10-CM | POA: Diagnosis present

## 2020-02-15 DIAGNOSIS — K219 Gastro-esophageal reflux disease without esophagitis: Secondary | ICD-10-CM | POA: Diagnosis present

## 2020-02-15 DIAGNOSIS — E876 Hypokalemia: Principal | ICD-10-CM | POA: Diagnosis present

## 2020-02-15 DIAGNOSIS — R197 Diarrhea, unspecified: Secondary | ICD-10-CM

## 2020-02-15 LAB — RESPIRATORY PANEL BY RT PCR (FLU A&B, COVID)
Influenza A by PCR: NEGATIVE
Influenza B by PCR: NEGATIVE
SARS Coronavirus 2 by RT PCR: NEGATIVE

## 2020-02-15 LAB — CBC
HCT: 36.6 % — ABNORMAL LOW (ref 39.0–52.0)
Hemoglobin: 13 g/dL (ref 13.0–17.0)
MCH: 34.2 pg — ABNORMAL HIGH (ref 26.0–34.0)
MCHC: 35.5 g/dL (ref 30.0–36.0)
MCV: 96.3 fL (ref 80.0–100.0)
Platelets: 109 10*3/uL — ABNORMAL LOW (ref 150–400)
RBC: 3.8 MIL/uL — ABNORMAL LOW (ref 4.22–5.81)
RDW: 12.3 % (ref 11.5–15.5)
WBC: 14 10*3/uL — ABNORMAL HIGH (ref 4.0–10.5)
nRBC: 0 % (ref 0.0–0.2)

## 2020-02-15 LAB — COMPREHENSIVE METABOLIC PANEL
ALT: 48 U/L — ABNORMAL HIGH (ref 0–44)
ALT: 24 U/L (ref 0–44)
AST: 69 U/L — ABNORMAL HIGH (ref 15–41)
AST: 33 U/L (ref 15–41)
Albumin: 3.7 g/dL (ref 3.5–5.0)
Albumin: 3 g/dL — ABNORMAL LOW (ref 3.5–5.0)
Alkaline Phosphatase: 106 U/L (ref 38–126)
Alkaline Phosphatase: 81 U/L (ref 38–126)
Anion gap: >20 — ABNORMAL HIGH (ref 5–15)
Anion gap: 11 (ref 5–15)
BUN: 20 mg/dL (ref 6–20)
BUN: 23 mg/dL — ABNORMAL HIGH (ref 6–20)
CO2: 26 mmol/L (ref 22–32)
CO2: 32 mmol/L (ref 22–32)
Calcium: 9.2 mg/dL (ref 8.9–10.3)
Calcium: 7.7 mg/dL — ABNORMAL LOW (ref 8.9–10.3)
Chloride: 69 mmol/L — ABNORMAL LOW (ref 98–111)
Chloride: 77 mmol/L — ABNORMAL LOW (ref 98–111)
Creatinine, Ser: 2.89 mg/dL — ABNORMAL HIGH (ref 0.61–1.24)
Creatinine, Ser: 1.95 mg/dL — ABNORMAL HIGH (ref 0.61–1.24)
GFR calc non Af Amer: 23 mL/min — ABNORMAL LOW (ref >60)
GFR calc non Af Amer: 38 mL/min — ABNORMAL LOW (ref >60)
Glucose, Bld: 143 mg/dL — ABNORMAL HIGH (ref 70–99)
Glucose, Bld: 92 mg/dL (ref 70–99)
Potassium: 2.4 mmol/L — CL (ref 3.5–5.1)
Potassium: 3.5 mmol/L (ref 3.5–5.1)
Sodium: 125 mmol/L — ABNORMAL LOW (ref 135–145)
Sodium: 120 mmol/L — ABNORMAL LOW (ref 135–145)
Total Bilirubin: 2.9 mg/dL — ABNORMAL HIGH (ref 0.3–1.2)
Total Bilirubin: 1.5 mg/dL — ABNORMAL HIGH (ref 0.3–1.2)
Total Protein: 8.6 g/dL — ABNORMAL HIGH (ref 6.5–8.1)
Total Protein: 6.8 g/dL (ref 6.5–8.1)

## 2020-02-15 LAB — I-STAT BETA HCG BLOOD, ED (MC, WL, AP ONLY): I-stat hCG, quantitative: <5 m[IU]/mL (ref <5)

## 2020-02-15 LAB — ETHANOL: Alcohol, Ethyl (B): <10 mg/dL (ref <10)

## 2020-02-15 LAB — CBC WITH DIFFERENTIAL/PLATELET
Abs Immature Granulocytes: 0.22 10*3/uL — ABNORMAL HIGH (ref 0.00–0.07)
Basophils Absolute: 0 10*3/uL (ref 0.0–0.1)
Basophils Relative: 0 %
Eosinophils Absolute: 0 10*3/uL (ref 0.0–0.5)
Eosinophils Relative: 0 %
HCT: 44.9 % (ref 39.0–52.0)
Hemoglobin: 15.7 g/dL (ref 13.0–17.0)
Immature Granulocytes: 1 %
Lymphocytes Relative: 5 %
Lymphs Abs: 1 10*3/uL (ref 0.7–4.0)
MCH: 33.9 pg (ref 26.0–34.0)
MCHC: 35 g/dL (ref 30.0–36.0)
MCV: 97 fL (ref 80.0–100.0)
Monocytes Absolute: 1 10*3/uL (ref 0.1–1.0)
Monocytes Relative: 5 %
Neutro Abs: 16.1 10*3/uL — ABNORMAL HIGH (ref 1.7–7.7)
Neutrophils Relative %: 89 %
Platelets: 146 10*3/uL — ABNORMAL LOW (ref 150–400)
RBC: 4.63 MIL/uL (ref 4.22–5.81)
RDW: 12.3 % (ref 11.5–15.5)
WBC: 18.3 10*3/uL — ABNORMAL HIGH (ref 4.0–10.5)
nRBC: 0 % (ref 0.0–0.2)

## 2020-02-15 LAB — MAGNESIUM: Magnesium: 1.8 mg/dL (ref 1.7–2.4)

## 2020-02-15 LAB — PHOSPHORUS: Phosphorus: 3.7 mg/dL (ref 2.5–4.6)

## 2020-02-15 LAB — I-STAT CHEM 8, ED
BUN: 19 mg/dL (ref 6–20)
Calcium, Ion: 0.92 mmol/L — ABNORMAL LOW (ref 1.15–1.40)
Chloride: 73 mmol/L — ABNORMAL LOW (ref 98–111)
Creatinine, Ser: 2.6 mg/dL — ABNORMAL HIGH (ref 0.61–1.24)
Glucose, Bld: 146 mg/dL — ABNORMAL HIGH (ref 70–99)
HCT: 51 % (ref 39.0–52.0)
Hemoglobin: 17.3 g/dL — ABNORMAL HIGH (ref 13.0–17.0)
Potassium: 3.3 mmol/L — ABNORMAL LOW (ref 3.5–5.1)
Sodium: 126 mmol/L — ABNORMAL LOW (ref 135–145)
TCO2: 32 mmol/L (ref 22–32)

## 2020-02-15 LAB — HEPATITIS C ANTIBODY: HCV Ab: NONREACTIVE

## 2020-02-15 LAB — HIV ANTIBODY (ROUTINE TESTING W REFLEX): HIV Screen 4th Generation wRfx: NONREACTIVE

## 2020-02-15 LAB — LIPASE, BLOOD: Lipase: 73 U/L — ABNORMAL HIGH (ref 11–51)

## 2020-02-15 MED ORDER — LORAZEPAM 2 MG/ML IJ SOLN
2.0000 mg | Freq: Once | INTRAMUSCULAR | Status: AC
  Administered 2020-02-15: 2 mg via INTRAVENOUS
  Filled 2020-02-15: qty 1

## 2020-02-15 MED ORDER — SODIUM CHLORIDE 0.9 % IV BOLUS
1000.0000 mL | Freq: Once | INTRAVENOUS | Status: AC
  Administered 2020-02-15: 10:00:00 1000 mL via INTRAVENOUS

## 2020-02-15 MED ORDER — IOHEXOL 9 MG/ML PO SOLN
ORAL | Status: AC
  Filled 2020-02-15: qty 1000

## 2020-02-15 MED ORDER — ONDANSETRON HCL 4 MG/2ML IJ SOLN: 4.0000 mg | Freq: Four times a day (QID) | INTRAMUSCULAR | Status: DC | PRN

## 2020-02-15 MED ORDER — LORAZEPAM 2 MG/ML IJ SOLN: 2.0000 mg | Freq: Once | INTRAMUSCULAR | Status: DC

## 2020-02-15 MED ORDER — POTASSIUM CHLORIDE 10 MEQ/100ML IV SOLN
10.0000 meq | INTRAVENOUS | Status: AC
  Administered 2020-02-15 (×5): 10 meq via INTRAVENOUS
  Filled 2020-02-15 (×6): qty 100

## 2020-02-15 MED ORDER — LORAZEPAM 1 MG PO TABS
1.0000 mg | ORAL_TABLET | ORAL | Status: DC | PRN
  Administered 2020-02-17: 1 mg via ORAL

## 2020-02-15 MED ORDER — THIAMINE HCL 100 MG/ML IJ SOLN: 100.0000 mg | Freq: Every day | INTRAMUSCULAR | Status: DC

## 2020-02-15 MED ORDER — LORAZEPAM 2 MG/ML IJ SOLN: 0.0000 mg | Freq: Two times a day (BID) | INTRAMUSCULAR | Status: DC

## 2020-02-15 MED ORDER — ACETAMINOPHEN 325 MG PO TABS
650.0000 mg | ORAL_TABLET | Freq: Four times a day (QID) | ORAL | Status: DC | PRN
  Administered 2020-02-17: 650 mg via ORAL
  Filled 2020-02-15: qty 2

## 2020-02-15 MED ORDER — THIAMINE HCL 100 MG PO TABS: 100.0000 mg | ORAL_TABLET | Freq: Every day | ORAL | Status: DC

## 2020-02-15 MED ORDER — POTASSIUM CHLORIDE IN NACL 40-0.9 MEQ/L-% IV SOLN
INTRAVENOUS | Status: DC
  Filled 2020-02-15 (×4): qty 1000

## 2020-02-15 MED ORDER — LORAZEPAM 2 MG/ML IJ SOLN
0.0000 mg | Freq: Four times a day (QID) | INTRAMUSCULAR | Status: AC
  Administered 2020-02-15 – 2020-02-16 (×5): 2 mg via INTRAVENOUS
  Filled 2020-02-15 (×4): qty 1

## 2020-02-15 MED ORDER — HEPARIN SODIUM (PORCINE) 5000 UNIT/ML IJ SOLN
5000.0000 [IU] | Freq: Three times a day (TID) | INTRAMUSCULAR | Status: DC
  Administered 2020-02-15 – 2020-02-18 (×8): 5000 [IU] via SUBCUTANEOUS
  Filled 2020-02-15 (×8): qty 1

## 2020-02-15 MED ORDER — POTASSIUM CHLORIDE CRYS ER 20 MEQ PO TBCR
40.0000 meq | EXTENDED_RELEASE_TABLET | Freq: Once | ORAL | Status: AC
  Administered 2020-02-15: 40 meq via ORAL
  Filled 2020-02-15: qty 2

## 2020-02-15 MED ORDER — ONDANSETRON HCL 4 MG PO TABS: 4.0000 mg | ORAL_TABLET | Freq: Four times a day (QID) | ORAL | Status: DC | PRN

## 2020-02-15 MED ORDER — LORAZEPAM 1 MG PO TABS: 0.0000 mg | ORAL_TABLET | Freq: Two times a day (BID) | ORAL | Status: DC

## 2020-02-15 MED ORDER — LORAZEPAM 2 MG/ML IJ SOLN
1.0000 mg | INTRAMUSCULAR | Status: DC | PRN
  Administered 2020-02-17: 1 mg via INTRAVENOUS
  Filled 2020-02-15: qty 2
  Filled 2020-02-15: qty 1

## 2020-02-15 MED ORDER — ADULT MULTIVITAMIN W/MINERALS CH
1.0000 | ORAL_TABLET | Freq: Every day | ORAL | Status: DC
  Administered 2020-02-16 – 2020-02-18 (×3): 1 via ORAL
  Filled 2020-02-15 (×4): qty 1

## 2020-02-15 MED ORDER — THIAMINE HCL 100 MG/ML IJ SOLN
100.0000 mg | Freq: Every day | INTRAMUSCULAR | Status: DC
  Administered 2020-02-15: 10:00:00 100 mg via INTRAVENOUS
  Filled 2020-02-15: qty 2

## 2020-02-15 MED ORDER — ACETAMINOPHEN 650 MG RE SUPP: 650.0000 mg | Freq: Four times a day (QID) | RECTAL | Status: DC | PRN

## 2020-02-15 MED ORDER — THIAMINE HCL 100 MG PO TABS
100.0000 mg | ORAL_TABLET | Freq: Every day | ORAL | Status: DC
  Administered 2020-02-16 – 2020-02-18 (×3): 100 mg via ORAL
  Filled 2020-02-15 (×3): qty 1

## 2020-02-15 MED ORDER — LORAZEPAM 1 MG PO TABS
0.0000 mg | ORAL_TABLET | Freq: Four times a day (QID) | ORAL | Status: AC
  Filled 2020-02-15: qty 1

## 2020-02-15 MED ORDER — FOLIC ACID 1 MG PO TABS
1.0000 mg | ORAL_TABLET | Freq: Every day | ORAL | Status: DC
  Administered 2020-02-16 – 2020-02-18 (×3): 1 mg via ORAL
  Filled 2020-02-15 (×4): qty 1

## 2020-02-15 NOTE — ED Notes (Signed)
CRITICAL VALUE ALERT  Critical Value:  Potassium 2.4  Date & Time Notied:  02/15/2020 @ 1107  Provider Notified: Dr Bernette Mayers  Orders Received/Actions taken: see new orders.

## 2020-02-15 NOTE — ED Notes (Signed)
Called to give report without success.

## 2020-02-15 NOTE — ED Notes (Signed)
Patient with increased AMS. Unaware of where he is, on bedside commode having BM. Refusing to allow staff to clean him. Unable to stand, two staff members required to assist patient to bed. Pulled IV out. Noted with right pupil larger than left. Patient pulling at gown, leads and restless. Hospitalist made aware. Mom at bedside. Safety sitter placed in view of patient at this time.

## 2020-02-15 NOTE — ED Notes (Signed)
Called to give report, nurse will call back when available.

## 2020-02-15 NOTE — ED Notes (Signed)
Patient transported to CT. Sitter remains with patient.

## 2020-02-15 NOTE — ED Provider Notes (Signed)
[facility] EMERGENCY DEPARTMENT Provider Note  CSN: [id number] Arrival date & time: 02/15/20 5208    History Chief Complaint  Patient presents with  . Emesis    HPI  Travis Weeks is a 54 y.o. male with history of alcohol abuse and pancreatitis reports he typically drinks about 6 beers a day. Starting Saturday, 5 days ago, he began to have multiple episodes of vomiting, diarrhea and epigastric pain. He has not been able to eat or drink anything during that time including alcohol. He started feeling his heart racing and having tremors yesterday.    Past Medical History:  Diagnosis Date  . GERD (gastroesophageal reflux disease)   . HTN (hypertension)   . Stomach ulcer     Past Surgical History:  Procedure Laterality Date  . BIOPSY  01/29/2017   Procedure: BIOPSY;  Surgeon: Corbin Ade, MD;  [facility];  Service: Endoscopy;;  gastric  . COLONOSCOPY WITH PROPOFOL N/A 01/29/2017   Procedure: COLONOSCOPY WITH PROPOFOL;  Surgeon: Corbin Ade, MD;  Location: [facility];  Service: Endoscopy;  Laterality: N/A;  1215  . ESOPHAGOGASTRODUODENOSCOPY (EGD) WITH PROPOFOL N/A 01/29/2017   Procedure: ESOPHAGOGASTRODUODENOSCOPY (EGD) WITH PROPOFOL;  Surgeon: Corbin Ade, MD;  [facility]: [facility];  Service: Endoscopy;  Laterality: N/A;  . FINGER SURGERY Left    middle finger of L hand. pt unable to move it    Family History  Problem Relation Age of Onset  . Hypertension Mother   . Diabetes Mother   . Colon cancer Neg Hx     Social History   Tobacco Use  . Smoking status: Current Every Day Smoker    Packs/day: 1.00    Years: 39.00    Pack years: 39.00    Types: Cigarettes  . Smokeless tobacco: Never Used  Vaping Use  . Vaping Use: Never used  Substance Use Topics  . Alcohol use: Yes    Alcohol/week: 2.0 - 3.0 standard drinks    Types: 2 - 3 Cans of beer per week    Comment: several days per week (01/06/17)  . Drug use: Yes    Comment: UDS positive  for cocaine 08/2016. No drugs in several months (as of 12/2016).     Home Medications Prior to Admission medications   Medication Sig Start Date End Date Taking? Authorizing Provider  folic acid (FOLVITE) 1 MG tablet Take 1 mg by mouth daily. 01/29/20  Yes [provider]  pantoprazole (PROTONIX) 40 MG tablet Take 40 mg by mouth daily.   Yes [provider]  famotidine (PEPCID) 20 MG tablet Take 1 tablet (20 mg total) by mouth 2 (two) times daily. Patient not taking: Reported on 02/15/2020 12/03/16   Jacquelin Hawking, PA-C  lisinopril (PRINIVIL,ZESTRIL) 20 MG tablet Take 1 tablet (20 mg total) by mouth daily. Patient not taking: Reported on 02/15/2020 02/09/17   Jacquelin Hawking, PA-C     Allergies    Patient has no known allergies.   Review of Systems   Review of Systems A comprehensive review of systems was completed and negative except as noted in HPI.    Physical Exam BP (!) 118/91   Pulse (!) 110   Temp 98.3 F (36.8 C) (Oral)   Resp 20   Ht 5\' 10"  (1.778 m)   Wt 65.8 kg   SpO2 95%   BMI 20.81 kg/m   Physical Exam Vitals and nursing note reviewed.  Constitutional:      Appearance:  He is ill-appearing.  HENT:     Head: Normocephalic and atraumatic.     Nose: Nose normal.     Mouth/Throat:     Mouth: Mucous membranes are moist.  Eyes:     Extraocular Movements: Extraocular movements intact.     Conjunctiva/sclera: Conjunctivae normal.  Cardiovascular:     Rate and Rhythm: Tachycardia present.  Pulmonary:     Effort: Pulmonary effort is normal.     Breath sounds: Normal breath sounds.  Abdominal:     General: Abdomen is flat.     Palpations: Abdomen is soft.     Tenderness: There is no abdominal tenderness.  Musculoskeletal:        General: No swelling. Normal range of motion.     Cervical back: Neck supple.  Skin:    General: Skin is warm and dry.  Neurological:     General: No focal deficit present.     Mental Status: He is alert.      Comments: tremulous  Psychiatric:        Mood and Affect: Mood normal.      ED Results / Procedures / Treatments   Labs (all labs ordered are listed, but only abnormal results are displayed) Labs Reviewed  COMPREHENSIVE METABOLIC PANEL - Abnormal; Notable for the following components:      Result Value   Sodium 125 (*)    Potassium 2.4 (*)    Chloride 69 (*)    Glucose, Bld 143 (*)    Creatinine, Ser 2.89 (*)    Total Protein 8.6 (*)    AST 69 (*)    ALT 48 (*)    Total Bilirubin 2.9 (*)    GFR calc non Af Amer 23 (*)    Anion gap >20 (*)    All other components within normal limits  LIPASE, BLOOD - Abnormal; Notable for the following components:   Lipase 73 (*)    All other components within normal limits  CBC WITH DIFFERENTIAL/PLATELET - Abnormal; Notable for the following components:   WBC 18.3 (*)    Platelets 146 (*)    Neutro Abs 16.1 (*)    Abs Immature Granulocytes 0.22 (*)    All other components within normal limits  I-STAT CHEM 8, ED - Abnormal; Notable for the following components:   Sodium 126 (*)    Potassium 3.3 (*)    Chloride 73 (*)    Creatinine, Ser 2.60 (*)    Glucose, Bld 146 (*)    Calcium, Ion 0.92 (*)    Hemoglobin 17.3 (*)    All other components within normal limits  RESPIRATORY PANEL BY RT PCR (FLU A&B, COVID)  ETHANOL  URINALYSIS, ROUTINE W REFLEX MICROSCOPIC  RAPID URINE DRUG SCREEN, HOSP PERFORMED  I-STAT BETA HCG BLOOD, ED ([facility], [facility], [facility] ONLY)    EKG EKG Interpretation  Date/Time:  Wednesday February 15 2020 11:23:55 EDT Ventricular Rate:  108 PR Interval:    QRS Duration: 91 QT Interval:  398 QTC Calculation: 534 R Axis:   -75 Text Interpretation: Muscle tremor Sinus tachycardia Probable inferior infarct, old Probable lateral infarct, old Prolonged QT interval Since last tracing Rate faster Confirmed by Susy Frizzle [phone]) on 02/15/2020 11:26:18 AM   Radiology No results found.  Procedures .Critical Care Performed by:  Pollyann Savoy, MD Authorized by: Pollyann Savoy, MD   Critical care provider statement:    Critical care time (minutes):  45   Critical care time was exclusive of:  Separately billable procedures and treating other patients   Critical care was necessary to treat or prevent imminent or life-threatening deterioration of the following conditions:  Renal failure and metabolic crisis   Critical care was time spent personally by me on the following activities:  Discussions with consultants, evaluation of patient's response to treatment, examination of patient, ordering and performing treatments and interventions, ordering and review of laboratory studies, ordering and review of radiographic studies, pulse oximetry, re-evaluation of patient's condition, obtaining history from patient or surrogate and review of old charts    Medications Ordered in the ED Medications  LORazepam (ATIVAN) injection 0-4 mg (2 mg Intravenous Given 02/15/20 1134)    Or  LORazepam (ATIVAN) tablet 0-4 mg ( Oral See Alternative 02/15/20 1134)  LORazepam (ATIVAN) injection 0-4 mg (has no administration in time range)    Or  LORazepam (ATIVAN) tablet 0-4 mg (has no administration in time range)  thiamine tablet 100 mg ( Oral See Alternative 02/15/20 1018)    Or  thiamine (B-1) injection 100 mg (100 mg Intravenous Given 02/15/20 1018)  potassium chloride 10 mEq in 100 mL IVPB (10 mEq Intravenous New Bag/Given 02/15/20 1140)  LORazepam (ATIVAN) injection 2 mg (2 mg Intravenous Not Given 02/15/20 1145)  sodium chloride 0.9 % bolus 1,000 mL (0 mLs Intravenous Stopped 02/15/20 1141)  potassium chloride SA (KLOR-CON) CR tablet 40 mEq (40 mEq Oral Given 02/15/20 1135)     MDM Rules/Calculators/A&P MDM Patient with N/V/D and epigastric pain in setting of ongoing alcohol abuse now with signs of acute withdrawal. Labs and IVF ordered, CIWA protocol initiated. EKG ordered.  ED Course  I have reviewed the triage vital signs and  the nursing notes.  Pertinent labs & imaging results that were available during my care of the patient were reviewed by me and considered in my medical decision making (see chart for details).  Clinical Course as of Feb 15 1227  Wed Feb 15, 2020  1017 CBC shows leukocytosis.    [CS]  1042 EtOH is neg. I-stat HCG was run instead of I-Stat Chem, Consulting civil engineer aware.    [CS]  1108 CMP shows marked hypokalemia and elevated creatinine above baseline. Will begin repletion, orally if he can tolerate as well as IV.    [CS]  1116 Lipase is mildly elevated.    [CS]  1124 HR improved after ativan but remains tremulous.    [CS]  1216 Spoke with Dr. Arbutus Leas, Hospitalist, who will evaluate for admission.    [CS]    Clinical Course User Index [CS] Pollyann Savoy, MD    Final Clinical Impression(s) / ED Diagnoses Final diagnoses:  Hypokalemia  AKI (acute kidney injury) (HCC)  Alcohol withdrawal syndrome without complication [facility])    Rx / DC Orders ED Discharge Orders    None       Pollyann Savoy, MD 02/15/20 1228

## 2020-02-15 NOTE — H&P (Signed)
History and Physical  Travis Weeks MEB:[id number] DOB: July 28, 1965 DOA: 02/15/2020   PCP: Patient, No Pcp Per   Patient coming from: Home  Chief Complaint: n/v  HPI:  Travis Weeks is a 54 y.o. male with medical history of hypertension, alcohol abuse, pancreatitis, peptic ulcer disease presenting with approximately 1 week history of nausea, vomiting, and diarrhea.  The patient is currently confused.  His mother at the bedside assists with the history.  The patient continues to drink at least 6 beers on a daily basis.  He has been drinking for nearly 30 years.  According to the patient's mom there has not been any hematemesis, hematochezia, melena.  The patient denies any fever, chills, headache, chest pain, shortness breath, hemoptysis.  He has a nonproductive cough.  He continues to smoke 1/2 pack/day.  He complains of epigastric abdominal pain.  He states that his last drink of alcohol was approximately 5 days prior to this admission.  He has been taking intermittent BC powders.  There is been no history of travel or eating exotic foods.  The patient is unable to tell me if he has been on any antibiotics recently.  He is unable to clarify how many times he has vomited or having diarrhea in the last 24 hours. In the emergency department, the patient was afebrile and hemodynamically stable.  He was tachycardic with heart rate of into the 120s.  Oxygen saturation was 92-95% on room air.  The patient was notably tremulous and started on Ativan with improvement.  Sodium 125, potassium 2.4, serum creatinine 2.89.  AST 69, ALT 48, alk phosphatase 106, total bilirubin 2.6.  WBC 18.3, hemoglobin 15.7, platelets 146,000  Assessment/Plan: Acute kidney injury -Secondary to volume depletion and poor solute intake -Renal ultrasound -Start IV fluids -Baseline creatinine 0.5-0.6 -Presented with serum creatinine 2.89  Intractable nausea and vomiting -Secondary to alcoholic gastritis and peptic ulcer  disease -lipase 73 -CT abdomen pelvis -Start Protonix -UA and urine culture  Alcohol withdrawal -Start CIWA protocol  Hyponatremia -Secondary to volume depletion and poor solute intake -Start IV fluids  Hypokalemia -Replete -Check magnesium  Diarrhea -Stool for C. Difficile -Stool pathogen panel  Essential hypertension -Holding lisinopril in the setting of AKI and dehydration  Transaminasemia -Secondary to alcohol use -Hepatitis B surface antigen-hepatitis C antibody  Leukocytosis -Likely stress demargination -Obtain chest x-ray -Blood cultures x2 sets -UA and urine culture      Past Medical History:  Diagnosis Date  . GERD (gastroesophageal reflux disease)   . HTN (hypertension)   . Stomach ulcer    Past Surgical History:  Procedure Laterality Date  . BIOPSY  01/29/2017   Procedure: BIOPSY;  Surgeon: Daneil Dolin, MD;  [facility];  Service: Endoscopy;;  gastric  . COLONOSCOPY WITH PROPOFOL N/A 01/29/2017   Procedure: COLONOSCOPY WITH PROPOFOL;  Surgeon: Daneil Dolin, MD;  [facility];  Service: Endoscopy;  Laterality: N/A;  1215  . ESOPHAGOGASTRODUODENOSCOPY (EGD) WITH PROPOFOL N/A 01/29/2017   Procedure: ESOPHAGOGASTRODUODENOSCOPY (EGD) WITH PROPOFOL;  Surgeon: Daneil Dolin, MD;  [facility];  Service: Endoscopy;  Laterality: N/A;  . FINGER SURGERY Left    middle finger of L hand. pt unable to move it   Social History:  reports that he has been smoking cigarettes. He has a 39.00 pack-year smoking history. He has never used smokeless tobacco. He reports current alcohol use of about 2.0 - 3.0 standard drinks of alcohol per week.  He reports current drug use.   Family History  Problem Relation Age of Onset  . Hypertension Mother   . Diabetes Mother   . Colon cancer Neg Hx      No Known Allergies   Prior to Admission medications   Medication Sig Start Date End Date Taking? Authorizing Provider  folic acid  (FOLVITE) 1 MG tablet Take 1 mg by mouth daily. 01/29/20  Yes [provider]  pantoprazole (PROTONIX) 40 MG tablet Take 40 mg by mouth daily.   Yes [provider]  famotidine (PEPCID) 20 MG tablet Take 1 tablet (20 mg total) by mouth 2 (two) times daily. Patient not taking: Reported on 02/15/2020 12/03/16   Soyla Dryer, PA-C  lisinopril (PRINIVIL,ZESTRIL) 20 MG tablet Take 1 tablet (20 mg total) by mouth daily. Patient not taking: Reported on 02/15/2020 02/09/17   Soyla Dryer, PA-C    Review of Systems:  Unobtainable secondary to the patient's confusion  Physical Exam: Vitals:   02/15/20 1100 02/15/20 1130 02/15/20 1145 02/15/20 1200  BP:  117/77  (!) 118/91  Pulse: (!) 119 (!) 116 (!) 111 (!) 110  Resp: (!) 21 (!) 28 (!) 24 20  Temp:      TempSrc:      SpO2: 100% 97% 90% 95%  Weight:      Height:       General:  A&O x 1, NAD, nontoxic, pleasant/cooperative Head/Eye: No conjunctival hemorrhage, no icterus, [place]/AT, No nystagmus ENT:  No icterus,  No thrush, good dentition, no pharyngeal exudate Neck:  No masses, no lymphadenpathy, no bruits CV:  RRR, no rub, no gallop, no S3 Lung: Diminished breath sounds bilateral.  Bibasilar rales.  No wheezing. Abdomen: soft/epigastric tender, +BS, nondistended, no peritoneal signs Ext: No cyanosis, No rashes, No petechiae, No lymphangitis, No edema Neuro: CNII-XII intact, strength 4/5 in bilateral upper and lower extremities, no dysmetria  Labs on Admission:  Basic Metabolic Panel: Recent Labs  Lab 02/15/20 1001 02/15/20 1102  NA 125* 126*  K 2.4* 3.3*  CL 69* 73*  CO2 26  --   GLUCOSE 143* 146*  BUN 20 19  CREATININE 2.89* 2.60*  CALCIUM 9.2  --    Liver Function Tests: Recent Labs  Lab 02/15/20 1001  AST 69*  ALT 48*  ALKPHOS 106  BILITOT 2.9*  PROT 8.6*  ALBUMIN 3.7   Recent Labs  Lab 02/15/20 1001  LIPASE 73*   No results for input(s): AMMONIA in the last 168 hours. CBC: Recent Labs   Lab 02/15/20 1001 02/15/20 1102  WBC 18.3*  --   NEUTROABS 16.1*  --   HGB 15.7 17.3*  HCT 44.9 51.0  MCV 97.0  --   PLT 146*  --    Coagulation Profile: No results for input(s): INR, PROTIME in the last 168 hours. Cardiac Enzymes: No results for input(s): CKTOTAL, CKMB, CKMBINDEX, TROPONINI in the last 168 hours. BNP: Invalid input(s): POCBNP CBG: No results for input(s): GLUCAP in the last 168 hours. Urine analysis:    Component Value Date/Time   COLORURINE YELLOW 09/04/2016 1746   APPEARANCEUR CLEAR 09/04/2016 1746   LABSPEC 1.004 (L) 09/04/2016 1746   PHURINE 5.0 09/04/2016 1746   GLUCOSEU NEGATIVE 09/04/2016 1746   HGBUR NEGATIVE 09/04/2016 1746   BILIRUBINUR NEGATIVE 09/04/2016 1746   KETONESUR NEGATIVE 09/04/2016 1746   PROTEINUR NEGATIVE 09/04/2016 1746   NITRITE NEGATIVE 09/04/2016 1746   LEUKOCYTESUR NEGATIVE 09/04/2016 1746   Sepsis Labs: $[email or web address]'[id number]$ (procalcitonin:4,lacticidven:4) ) Recent Results (from  the past 240 hour(s))  Respiratory Panel by RT PCR (Flu A&B, Covid) - Nasopharyngeal Swab     Status: None   Collection Time: 02/15/20 10:01 AM   Specimen: Nasopharyngeal Swab  Result Value Ref Range Status   SARS Coronavirus 2 by RT PCR NEGATIVE NEGATIVE Final    Comment: (NOTE) SARS-CoV-2 target nucleic acids are NOT DETECTED.  The SARS-CoV-2 RNA is generally detectable in upper respiratoy specimens during the acute phase of infection. The lowest concentration of SARS-CoV-2 viral copies this assay can detect is 131 copies/mL. A negative result does not preclude SARS-Cov-2 infection and should not be used as the sole basis for treatment or other patient management decisions. A negative result may occur with  improper specimen collection/handling, submission of specimen other than nasopharyngeal swab, presence of viral mutation(s) within the areas targeted by this assay, and inadequate number of viral copies (<131 copies/mL). A negative result must be  combined with clinical observations, patient history, and epidemiological information. The expected result is Negative.  Fact Sheet for Patients:  PinkCheek.be  Fact Sheet for Healthcare Providers:  GravelBags.it  This test is no t yet approved or cleared by the [place] FDA and  has been authorized for detection and/or diagnosis of SARS-CoV-2 by FDA under an Emergency Use Authorization (EUA). This EUA will remain  in effect (meaning this test can be used) for the duration of the COVID-19 declaration under Section 564(b)(1) of the Act, 21 U.S.C. section 360bbb-3(b)(1), unless the authorization is terminated or revoked sooner.     Influenza A by PCR NEGATIVE NEGATIVE Final   Influenza B by PCR NEGATIVE NEGATIVE Final    Comment: (NOTE) The Xpert Xpress SARS-CoV-2/FLU/RSV assay is intended as an aid in  the diagnosis of influenza from Nasopharyngeal swab specimens and  should not be used as a sole basis for treatment. Nasal washings and  aspirates are unacceptable for Xpert Xpress SARS-CoV-2/FLU/RSV  testing.  Fact Sheet for Patients: PinkCheek.be  Fact Sheet for Healthcare Providers: GravelBags.it  This test is not yet approved or cleared by the [place] FDA and  has been authorized for detection and/or diagnosis of SARS-CoV-2 by  FDA under an Emergency Use Authorization (EUA). This EUA will remain  in effect (meaning this test can be used) for the duration of the  Covid-19 declaration under Section 564(b)(1) of the Act, 21  U.S.C. section 360bbb-3(b)(1), unless the authorization is  terminated or revoked. Performed at [facility], [address], [place], [place] [address]      Radiological Exams on Admission: No results found.  EKG: Independently reviewed. Sinus, nonspecific TWI    Time spent:60 minutes Code Status:   FULL Family  Communication:  No Family at bedside Disposition Plan: expect 2-3 day hospitalization Consults called: none DVT Prophylaxis: [place] Heparin     Orson Eva, DO  [facility] Pager [phone]  If 7PM-7AM, please contact night-coverage [email or web address] Password [facility] 02/15/2020, 12:38 PM

## 2020-02-15 NOTE — ED Notes (Signed)
Currently drinking PO contrast.

## 2020-02-15 NOTE — ED Triage Notes (Signed)
Pt c/o of N/V/D x 5 days. Pt is a 6 beer per day drinker and has not had a drink since Saturday. Pt states the vomiting started before he stopped drinking. Pt has started with trembles in last 24 hours.

## 2020-02-16 ENCOUNTER — Inpatient Hospital Stay (HOSPITAL_COMMUNITY): Payer: Self-pay

## 2020-02-16 ENCOUNTER — Encounter (HOSPITAL_COMMUNITY): Payer: Self-pay | Admitting: Internal Medicine

## 2020-02-16 DIAGNOSIS — R112 Nausea with vomiting, unspecified: Secondary | ICD-10-CM

## 2020-02-16 DIAGNOSIS — R1110 Vomiting, unspecified: Secondary | ICD-10-CM

## 2020-02-16 DIAGNOSIS — R197 Diarrhea, unspecified: Secondary | ICD-10-CM

## 2020-02-16 DIAGNOSIS — K529 Noninfective gastroenteritis and colitis, unspecified: Secondary | ICD-10-CM

## 2020-02-16 LAB — C DIFFICILE QUICK SCREEN W PCR REFLEX
C Diff antigen: NEGATIVE
C Diff interpretation: NOT DETECTED
C Diff toxin: NEGATIVE

## 2020-02-16 LAB — RAPID URINE DRUG SCREEN, HOSP PERFORMED
Amphetamines: NOT DETECTED
Barbiturates: NOT DETECTED
Benzodiazepines: POSITIVE — AB
Cocaine: NOT DETECTED
Opiates: NOT DETECTED
Tetrahydrocannabinol: NOT DETECTED

## 2020-02-16 LAB — COMPREHENSIVE METABOLIC PANEL
ALT: 21 U/L (ref 0–44)
AST: 32 U/L (ref 15–41)
Albumin: 2.7 g/dL — ABNORMAL LOW (ref 3.5–5.0)
Alkaline Phosphatase: 69 U/L (ref 38–126)
Anion gap: 10 (ref 5–15)
BUN: 24 mg/dL — ABNORMAL HIGH (ref 6–20)
CO2: 30 mmol/L (ref 22–32)
Calcium: 8 mg/dL — ABNORMAL LOW (ref 8.9–10.3)
Chloride: 83 mmol/L — ABNORMAL LOW (ref 98–111)
Creatinine, Ser: 1.18 mg/dL (ref 0.61–1.24)
GFR calc non Af Amer: >60 mL/min (ref >60)
Glucose, Bld: 82 mg/dL (ref 70–99)
Potassium: 3.5 mmol/L (ref 3.5–5.1)
Sodium: 123 mmol/L — ABNORMAL LOW (ref 135–145)
Total Bilirubin: 1.1 mg/dL (ref 0.3–1.2)
Total Protein: 6 g/dL — ABNORMAL LOW (ref 6.5–8.1)

## 2020-02-16 LAB — URINALYSIS, ROUTINE W REFLEX MICROSCOPIC
Bilirubin Urine: NEGATIVE
Glucose, UA: NEGATIVE mg/dL
Hgb urine dipstick: NEGATIVE
Ketones, ur: NEGATIVE mg/dL
Leukocytes,Ua: NEGATIVE
Nitrite: NEGATIVE
Protein, ur: NEGATIVE mg/dL
Specific Gravity, Urine: 1.005 (ref 1.005–1.030)
pH: 8 (ref 5.0–8.0)

## 2020-02-16 LAB — CBC
HCT: 35.5 % — ABNORMAL LOW (ref 39.0–52.0)
Hemoglobin: 12.4 g/dL — ABNORMAL LOW (ref 13.0–17.0)
MCH: 34 pg (ref 26.0–34.0)
MCHC: 34.9 g/dL (ref 30.0–36.0)
MCV: 97.3 fL (ref 80.0–100.0)
Platelets: 105 10*3/uL — ABNORMAL LOW (ref 150–400)
RBC: 3.65 MIL/uL — ABNORMAL LOW (ref 4.22–5.81)
RDW: 12 % (ref 11.5–15.5)
WBC: 10.4 10*3/uL (ref 4.0–10.5)
nRBC: 0 % (ref 0.0–0.2)

## 2020-02-16 LAB — HEPATITIS B SURFACE ANTIGEN: Hepatitis B Surface Ag: NONREACTIVE

## 2020-02-16 LAB — PROCALCITONIN: Procalcitonin: 0.28 ng/mL

## 2020-02-16 LAB — MAGNESIUM: Magnesium: 1.9 mg/dL (ref 1.7–2.4)

## 2020-02-16 LAB — HEMOGLOBIN A1C
Hgb A1c MFr Bld: 4.6 % — ABNORMAL LOW (ref 4.8–5.6)
Mean Plasma Glucose: 85.32 mg/dL

## 2020-02-16 MED ORDER — PANTOPRAZOLE SODIUM 40 MG PO TBEC
40.0000 mg | DELAYED_RELEASE_TABLET | Freq: Every day | ORAL | Status: DC
  Administered 2020-02-17 – 2020-02-18 (×2): 40 mg via ORAL
  Filled 2020-02-16 (×2): qty 1

## 2020-02-16 NOTE — Progress Notes (Addendum)
PROGRESS NOTE  Travis Weeks OYD:[id number] DOB: 10/16/1965 DOA: 02/15/2020 PCP: Patient, No Pcp Per  Brief History:  54 y.o. male with medical history of hypertension, alcohol abuse, pancreatitis, peptic ulcer disease presenting with approximately 1 week history of nausea, vomiting, and diarrhea.  The patient is currently confused.  His mother at the bedside assists with the history.  The patient continues to drink at least 6 beers on a daily basis.  He has been drinking for nearly 30 years.  According to the patient's mom there has not been any hematemesis, hematochezia, melena.  The patient denies any fever, chills, headache, chest pain, shortness breath, hemoptysis.  He has a nonproductive cough.  He continues to smoke 1/2 pack/day.  He complains of epigastric abdominal pain.  He states that his last drink of alcohol was approximately 5 days prior to this admission.  He has been taking intermittent BC powders.  There is been no history of travel or eating exotic foods.  The patient is unable to tell me if he has been on any antibiotics recently.  He is unable to clarify how many times he has vomited or having diarrhea in the last 24 hours. In the emergency department, the patient was afebrile and hemodynamically stable.  He was tachycardic with heart rate of into the 120s.  Oxygen saturation was 92-95% on room air.  The patient was notably tremulous and started on Ativan with improvement.  Sodium 125, potassium 2.4, serum creatinine 2.89.  AST 69, ALT 48, alk phosphatase 106, total bilirubin 2.6.  WBC 18.3, hemoglobin 15.7, platelets 146,000  Assessment/Plan: Acute kidney injury -Secondary to volume depletion and poor solute intake -Continue IV fluids-->improving -Baseline creatinine 0.5-0.6 -Presented with serum creatinine 2.89  Intractable nausea and vomiting/??Ileitis -Secondary to alcoholic gastritis and peptic ulcer disease -lipase 73 -10/6 CT abd/pelvis--mild thickening of TI  without surrounding inflammation ;patcy nodular lingular opacity  -continue Protonix -UA and urine culture -GI consult  Alcohol withdrawal -Continue CIWA protocol  Hyponatremia -Secondary to AKI volume depletion and poor solute intake -continue IV fluids  Hypokalemia -Repleted -Check magnesium--1.9  Diarrhea -Stool for C. Difficile--neg -Stool pathogen panel  Essential hypertension -Holding lisinopril in the setting of AKI and dehydration  Transaminasemia -Secondary to alcohol use -Hepatitis B surface antigen--neg -hepatitis C antibody--neg  Leukocytosis -Likely stress demargination -UA and urine culture -personally reviewed CXR--hyperexpanded, no consolidation -PCT 0.28 -remain off abx for now  Thrombocytopenia -due to Etoh -check B12 -folate -no hepatosplenomegaly on CT       Status is: Inpatient  Remains inpatient appropriate because:Altered mental status   Dispo: The patient is from: Home              Anticipated d/c is to: Home              Anticipated d/c date is: 1-2 days              Patient currently is not medically stable to d/c.        Family Communication:   Mother updated at bedside 10/7  Consultants:  GI  Code Status:   DNR  DVT Prophylaxis:  [place] Heparin    Procedures: As Listed in Progress Note Above  Antibiotics: None       Subjective: Patient more alert, less agitated but remains pleasantly confused.  Patient denies fevers, chills, headache, chest pain, dyspnea, nausea, vomiting, diarrhea, abdominal pain,    Objective: Vitals:   02/15/20 1730  02/15/20 1800 02/15/20 1900 02/15/20 1928  BP: 117/82 125/84 108/80 129/83  Pulse: 89 86 92 96  Resp:    16  Temp:    98.1 F (36.7 C)  TempSrc:    Oral  SpO2: 99% 98% 96% 97%  Weight:    59.6 kg  Height:    _0  (1.778 m)    Intake/Output Summary (Last 24 hours) at 02/16/2020 1138 Last data filed at 02/16/2020 0300 Gross per 24 hour  Intake 2090.61 ml   Output --  Net 2090.61 ml   Weight change:  Exam:   General:  Pt is alert, follows commands appropriately, not in acute distress  HEENT: No icterus, No thrush, No neck mass, [place]/AT  Cardiovascular: RRR, S1/S2, no rubs, no gallops  Respiratory: bibasilar rales. No wheeze  Abdomen: Soft/+BS, epigastric tender, non distended, no guarding  Extremities: No edema, No lymphangitis, No petechiae, No rashes, no synovitis   Data Reviewed: I have personally reviewed following labs and imaging studies Basic Metabolic Panel: Recent Labs  Lab 02/15/20 1001 02/15/20 1102 02/15/20 1607 02/16/20 0515  NA 125* 126* 120* 123*  K 2.4* 3.3* 3.5 3.5  CL 69* 73* 77* 83*  CO2 26  --  32 30  GLUCOSE 143* 146* 92 82  BUN 20 19 23* 24*  CREATININE 2.89* 2.60* 1.95* 1.18  CALCIUM 9.2  --  7.7* 8.0*  MG  --   --  1.8 1.9  PHOS  --   --  3.7  --    Liver Function Tests: Recent Labs  Lab 02/15/20 1001 02/15/20 1607 02/16/20 0515  AST 69* 33 32  ALT 48* 24 21  ALKPHOS 106 81 69  BILITOT 2.9* 1.5* 1.1  PROT 8.6* 6.8 6.0*  ALBUMIN 3.7 3.0* 2.7*   Recent Labs  Lab 02/15/20 1001  LIPASE 73*   No results for input(s): AMMONIA in the last 168 hours. Coagulation Profile: No results for input(s): INR, PROTIME in the last 168 hours. CBC: Recent Labs  Lab 02/15/20 1001 02/15/20 1102 02/15/20 1607 02/16/20 0515  WBC 18.3*  --  14.0* 10.4  NEUTROABS 16.1*  --   --   --   HGB 15.7 17.3* 13.0 12.4*  HCT 44.9 51.0 36.6* 35.5*  MCV 97.0  --  96.3 97.3  PLT 146*  --  109* 105*   Cardiac Enzymes: No results for input(s): CKTOTAL, CKMB, CKMBINDEX, TROPONINI in the last 168 hours. BNP: Invalid input(s): POCBNP CBG: No results for input(s): GLUCAP in the last 168 hours. HbA1C: Recent Labs    02/15/20 1001  HGBA1C 4.6*   Urine analysis:    Component Value Date/Time   COLORURINE YELLOW 09/04/2016 1746   APPEARANCEUR CLEAR 09/04/2016 1746   LABSPEC 1.004 (L) 09/04/2016 1746    PHURINE 5.0 09/04/2016 1746   GLUCOSEU NEGATIVE 09/04/2016 1746   HGBUR NEGATIVE 09/04/2016 1746   BILIRUBINUR NEGATIVE 09/04/2016 1746   KETONESUR NEGATIVE 09/04/2016 1746   PROTEINUR NEGATIVE 09/04/2016 1746   NITRITE NEGATIVE 09/04/2016 1746   LEUKOCYTESUR NEGATIVE 09/04/2016 1746   Sepsis Labs: _1 (procalcitonin:4,lacticidven:4) ) Recent Results (from the past 240 hour(s))  Respiratory Panel by RT PCR (Flu A&B, Covid) - Nasopharyngeal Swab     Status: None   Collection Time: 02/15/20 10:01 AM   Specimen: Nasopharyngeal Swab  Result Value Ref Range Status   SARS Coronavirus 2 by RT PCR NEGATIVE NEGATIVE Final    Comment: (NOTE) SARS-CoV-2 target nucleic acids are NOT DETECTED.  The SARS-CoV-2 RNA  is generally detectable in upper respiratoy specimens during the acute phase of infection. The lowest concentration of SARS-CoV-2 viral copies this assay can detect is 131 copies/mL. A negative result does not preclude SARS-Cov-2 infection and should not be used as the sole basis for treatment or other patient management decisions. A negative result may occur with  improper specimen collection/handling, submission of specimen other than nasopharyngeal swab, presence of viral mutation(s) within the areas targeted by this assay, and inadequate number of viral copies (<131 copies/mL). A negative result must be combined with clinical observations, patient history, and epidemiological information. The expected result is Negative.  Fact Sheet for Patients:  PinkCheek.be  Fact Sheet for Healthcare Providers:  GravelBags.it  This test is no t yet approved or cleared by the [place] FDA and  has been authorized for detection and/or diagnosis of SARS-CoV-2 by FDA under an Emergency Use Authorization (EUA). This EUA will remain  in effect (meaning this test can be used) for the duration of the COVID-19 declaration under  Section 564(b)(1) of the Act, 21 U.S.C. section 360bbb-3(b)(1), unless the authorization is terminated or revoked sooner.     Influenza A by PCR NEGATIVE NEGATIVE Final   Influenza B by PCR NEGATIVE NEGATIVE Final    Comment: (NOTE) The Xpert Xpress SARS-CoV-2/FLU/RSV assay is intended as an aid in  the diagnosis of influenza from Nasopharyngeal swab specimens and  should not be used as a sole basis for treatment. Nasal washings and  aspirates are unacceptable for Xpert Xpress SARS-CoV-2/FLU/RSV  testing.  Fact Sheet for Patients: PinkCheek.be  Fact Sheet for Healthcare Providers: GravelBags.it  This test is not yet approved or cleared by the [place] FDA and  has been authorized for detection and/or diagnosis of SARS-CoV-2 by  FDA under an Emergency Use Authorization (EUA). This EUA will remain  in effect (meaning this test can be used) for the duration of the  Covid-19 declaration under Section 564(b)(1) of the Act, 21  U.S.C. section 360bbb-3(b)(1), unless the authorization is  terminated or revoked. Performed at [facility], [address] [place], [place] [address]      Scheduled Meds: . folic acid  1 mg Oral Daily  . heparin  5,000 Units Subcutaneous Q8H  . LORazepam  0-4 mg Intravenous Q6H   Or  . LORazepam  0-4 mg Oral Q6H  . [START ON 02/17/2020] LORazepam  0-4 mg Intravenous Q12H   Or  . [START ON 02/17/2020] LORazepam  0-4 mg Oral Q12H  . LORazepam  2 mg Intravenous Once  . multivitamin with minerals  1 tablet Oral Daily  . thiamine  100 mg Oral Daily   Or  . thiamine  100 mg Intravenous Daily   Continuous Infusions: . 0.9 % NaCl with KCl 40 mEq / L 125 mL/hr at 02/15/20 1444    Procedures/Studies: CT ABDOMEN PELVIS WO CONTRAST  Result Date: 02/15/2020 CLINICAL DATA:  Abdominal pain and intractable vomiting. EXAM: CT ABDOMEN AND PELVIS WITHOUT CONTRAST TECHNIQUE: Multidetector CT imaging of  the abdomen and pelvis was performed following the standard protocol without IV contrast. COMPARISON:  None. FINDINGS: Lower chest: Patchy nodular airspace density in the lingula is identified, image 25/4. No pleural effusion. Hepatobiliary: Hepatic steatosis. No focal liver abnormality. Gallbladder is normal. No biliary ductal dilatation. Pancreas: Unremarkable. No pancreatic ductal dilatation or surrounding inflammatory changes. Spleen: Normal in size without focal abnormality. Adrenals/Urinary Tract: Normal appearance of the adrenal glands. There is no kidney mass or hydronephrosis identified bilaterally.  The urinary bladder appears normal. Stomach/Bowel: Small hiatal hernia. The appendix is visualized and appears normal. The stomach is nondistended. No small bowel wall thickening, there is equivocal, mild wall thickening involving the terminal ileum without surrounding inflammation. Liquid stool identified within the colon. Vascular/Lymphatic: Aortic atherosclerosis. No aneurysm. No abdominal or pelvic adenopathy. Reproductive: Prostate is unremarkable. Other: No free fluid or fluid collections. Musculoskeletal: Multiple remote left posterior rib fracture deformities. Lumbar degenerative disc disease. IMPRESSION: 1. Equivocal, mild wall thickening involving the terminal ileum without surrounding inflammation. Findings may reflect sequelae of inflammatory or infectious enteritis. 2. Liquid stool identified within the colon which may reflect underlying diarrheal state. 3. Hepatic steatosis. 4. Patchy nodular airspace density in the lingula is identified. Consider further evaluation with chest radiograph to assess for pneumonia. 5. Aortic atherosclerosis. Aortic Atherosclerosis (ICD10-I70.0). Electronically Signed   By: Kerby Moors M.D.   On: 02/15/2020 15:48    Orson Eva, DO  [facility]  If 7PM-7AM, please contact night-coverage [email or web address] Password [email or web address] 02/16/2020, 11:38 AM   LOS: 1 day

## 2020-02-16 NOTE — Consult Note (Signed)
Referring Provider: Catarina Hartshornat, David, MD Primary Care Physician:  Patient, No Pcp Per Primary Gastroenterologist:  Roetta SessionsMichael Rourk, MD  Reason for Consultation:  ileitis  HPI: Travis Weeks is a 54 y.o. male with h/o alcohol-induced pancreatitis, duodenitis, etoh dependence, hyponatremia who presented to the ED with weakness, N/V, diarrhea.   Ad[facility] in 11/2019 with abdominal pain, nonspecific elevation of lipase in the low 100s. CT with normal appearing pancreas but abnormal duodenum. H.pylori antibody negative 11/2019. Discharged on pantoprazole 40mg  BID, continue ASA powders. Plans to follow up with Dr. Arlyn DunningMegan Straughan as outpatient for EGD.   Patient reports one week history of N/V, diarrhea. BM up to 15 times daily. No melena, brbpr. Abdomen has been sore from all the heaving. No ill contacts or recent antibiotics. Last beer was five days ago due to vomiting. Typically consumes at least six beers daily and has drank for nearly 30 years. He takes BC powders intermittently. Denies fever. He has nonproductive cough. The day of presentation he was confused, weak and unable to ambulate. Mom found him in his yard. He has had a lot of heartburn since the vomiting started and he has been unable to keep his medications down. No hematemesis. Develops some tremors in the 24 hours preceding his presentation.    In the ED, sodium 125, potassium 2.4, chloride 69, creatinine 2.89, AST 69, ALT 48, Tbili 2.9, AP 106, alb 3.7, lipase 73, wbc 18300, H/H 15.7/44.9, platelets 146000. Hep B surface Ag neg, HCV Ab neg. Today sodium 123, potassium 3.5, AST/ALT normal, alb 2.7, tbili 1.1, wbc 10,400, Hgb 12.4, platelets 105000. Creatinine 1.18.  Cdiff negative. Gi path panel pending.   CT Abd/pelvis without contrast: mild wall thickening involving TI without surrounding inflammation. Liquid stool within colon. Fatty liver.    Prior to Admission medications   Medication Sig Start Date End Date Taking? Authorizing  Provider  folic acid (FOLVITE) 1 MG tablet Take 1 mg by mouth daily. 01/29/20  Yes [provider]  pantoprazole (PROTONIX) 40 MG tablet Take 40 mg by mouth daily.   Yes [provider]  famotidine (PEPCID) 20 MG tablet Take 1 tablet (20 mg total) by mouth 2 (two) times daily. Patient not taking: Reported on 02/15/2020 12/03/16   Jacquelin HawkingMcElroy, Shannon, PA-C  lisinopril (PRINIVIL,ZESTRIL) 20 MG tablet Take 1 tablet (20 mg total) by mouth daily. Patient not taking: Reported on 02/15/2020 02/09/17   Jacquelin HawkingMcElroy, Shannon, PA-C    Current Facility-Administered Medications  Medication Dose Route Frequency Provider Last Rate Last Admin  . 0.9 % NaCl with KCl 40 mEq / L  infusion   Intravenous Continuous Catarina Hartshornat, David, MD 125 mL/hr at 02/15/20 1444 New Bag at 02/15/20 1444  . acetaminophen (TYLENOL) tablet 650 mg  650 mg Oral Q6H PRN Tat, David, MD       Or  . acetaminophen (TYLENOL) suppository 650 mg  650 mg Rectal Q6H PRN Tat, Onalee Huaavid, MD      . folic acid (FOLVITE) tablet 1 mg  1 mg Oral Daily Tat, David, MD   1 mg at 02/16/20 1103  . heparin injection 5,000 Units  5,000 Units Subcutaneous Andres LabrumQ8H Tat, David, MD   5,000 Units at 02/16/20 [phone]  . LORazepam (ATIVAN) injection 0-4 mg  0-4 mg Intravenous Q6H Pollyann SavoySheldon, Charles B, MD   2 mg at 02/16/20 1104   Or  . LORazepam (ATIVAN) tablet 0-4 mg  0-4 mg Oral Q6H Pollyann SavoySheldon, Charles B, MD      . Melene Muller[START  ON 02/17/2020] LORazepam (ATIVAN) injection 0-4 mg  0-4 mg Intravenous Q12H Pollyann Savoy, MD       Or  . Melene Muller ON 02/17/2020] LORazepam (ATIVAN) tablet 0-4 mg  0-4 mg Oral Q12H Pollyann Savoy, MD      . LORazepam (ATIVAN) tablet 1-4 mg  1-4 mg Oral Q1H PRN Catarina Hartshorn, MD       Or  . LORazepam (ATIVAN) injection 1-4 mg  1-4 mg Intravenous Q1H PRN Tat, David, MD      . LORazepam (ATIVAN) injection 2 mg  2 mg Intravenous Once Pollyann Savoy, MD      . multivitamin with minerals tablet 1 tablet  1 tablet Oral Daily Tat, David, MD   1 tablet at  02/16/20 1103  . ondansetron (ZOFRAN) tablet 4 mg  4 mg Oral Q6H PRN Tat, David, MD       Or  . ondansetron (ZOFRAN) injection 4 mg  4 mg Intravenous Q6H PRN Tat, Onalee Hua, MD      . pantoprazole (PROTONIX) EC tablet 40 mg  40 mg Oral Daily Tat, David, MD      . thiamine tablet 100 mg  100 mg Oral Daily Tat, David, MD   100 mg at 02/16/20 1103   Or  . thiamine (B-1) injection 100 mg  100 mg Intravenous Daily Tat, Onalee Hua, MD        Allergies as of 02/15/2020  . (No Known Allergies)    Past Medical History:  Diagnosis Date  . GERD (gastroesophageal reflux disease)   . HTN (hypertension)   . Stomach ulcer     Past Surgical History:  Procedure Laterality Date  . BIOPSY  01/29/2017   Procedure: BIOPSY;  Surgeon: Corbin Ade, MD;  Location: [facility];  Service: Endoscopy;;  gastric  . COLONOSCOPY WITH PROPOFOL N/A 01/29/2017   Rourk: diverticulosis. next colonoscopy in 10 years  . ESOPHAGOGASTRODUODENOSCOPY (EGD) WITH PROPOFOL N/A 01/29/2017   Rourk: erosive reflux esophagitis, gastric erosions (benign bx and no h.pylori), duodenal erosions  . FINGER SURGERY Left    middle finger of L hand. pt unable to move it    Family History  Problem Relation Age of Onset  . Hypertension Mother   . Diabetes Mother   . Colon cancer Neg Hx     Social History   Socioeconomic History  . Marital status: Single    Spouse name: Not on file  . Number of children: Not on file  . Years of education: Not on file  . Highest education level: Not on file  Occupational History  . Occupation: Advice worker  Tobacco Use  . Smoking status: Current Every Day Smoker    Packs/day: 1.00    Years: 39.00    Pack years: 39.00    Types: Cigarettes  . Smokeless tobacco: Never Used  Vaping Use  . Vaping Use: Never used  Substance and Sexual Activity  . Alcohol use: Yes    Alcohol/week: 2.0 - 3.0 standard drinks    Types: 2 - 3 Cans of beer per week    Comment: several days per week (01/06/17)  . Drug  use: Yes    Comment: UDS positive for cocaine 08/2016. No drugs in several months (as of 12/2016).  . Sexual activity: Not Currently  Other Topics Concern  . Not on file  Social History Narrative  . Not on file   Social Determinants of Health   Financial Resource Strain:   . Difficulty  of Paying Living Expenses: Not on file  Food Insecurity:   . Worried About Programme researcher, broadcasting/film/video in the Last Year: Not on file  . Ran Out of Food in the Last Year: Not on file  Transportation Needs:   . Lack of Transportation (Medical): Not on file  . Lack of Transportation (Non-Medical): Not on file  Physical Activity:   . Days of Exercise per Week: Not on file  . Minutes of Exercise per Session: Not on file  Stress:   . Feeling of Stress : Not on file  Social Connections:   . Frequency of Communication with Friends and Family: Not on file  . Frequency of Social Gatherings with Friends and Family: Not on file  . Attends Religious Services: Not on file  . Active Member of Clubs or Organizations: Not on file  . Attends Banker Meetings: Not on file  . Marital Status: Not on file  Intimate Partner Violence:   . Fear of Current or Ex-Partner: Not on file  . Emotionally Abused: Not on file  . Physically Abused: Not on file  . Sexually Abused: Not on file     ROS:  General: Negative for fever, chills. See hpi. Eyes: Negative for vision changes.  ENT: Negative for hoarseness, difficulty swallowing , nasal congestion. CV: Negative for chest pain, angina, palpitations, dyspnea on exertion, peripheral edema.  Respiratory: Negative for dyspnea at rest, dyspnea on exertion,  sputum, wheezing. See hpi GI: See history of present illness. GU:  Negative for dysuria, hematuria, urinary incontinence, urinary frequency, nocturnal urination.  MS: Negative for joint pain, low back pain.  Derm: Negative for rash or itching.  Neuro: Negative for weakness, abnormal sensation, seizure, frequent  headaches, memory loss. See hpi.  Psych: Negative for anxiety, depression, suicidal ideation, hallucinations.  Endo: Negative for unusual weight change.  Heme: Negative for bruising or bleeding. Allergy: Negative for rash or hives.       Physical Examination: Vital signs in last 24 hours: Temp:  [98.1 F (36.7 C)-98.3 F (36.8 C)] 98.3 F (36.8 C) (10/07 1300) Pulse Rate:  [92-96] 92 (10/07 1300) Resp:  [16-18] 18 (10/07 1300) BP: (109-129)/(78-83) 109/78 (10/07 1300) SpO2:  [96 %-97 %] 96 % (10/07 1300) Weight:  [59.6 kg] 59.6 kg (10/06 1928) Last BM Date: 02/16/20  General: appears older than states age. hard of hearing but alert and oriented. Conversant. Mother at bedside.  no acute distress.  Head: Normocephalic, atraumatic.   Eyes: Conjunctiva pink, no icterus. Mouth: Oropharyngeal mucosa moist and pink , no lesions erythema or exudate. Neck: Supple without thyromegaly, masses, or lymphadenopathy.  Lungs: Clear to auscultation bilaterally.  Heart: Regular rate and rhythm, no murmurs rubs or gallops.  Abdomen: Bowel sounds are normal, nondistended, no hepatosplenomegaly or masses, no abdominal bruits or    hernia , no rebound or guarding.  Mild rlq tenderness Rectal: not performed Extremities: No lower extremity edema, clubbing, deformity.  Neuro: Alert and oriented x 4 , grossly normal neurologically.  Skin: Warm and dry, no rash or jaundice.   Psych: Alert and cooperative, normal mood and affect.        Intake/Output from previous day: 10/06 0701 - 10/07 0700 In: 2090.6 [I.V.:990.6; IV Piggyback:1100] Out: -  Intake/Output this shift: No intake/output data recorded.  Lab Results: CBC Recent Labs    02/15/20 1001 02/15/20 1001 02/15/20 1102 02/15/20 1607 02/16/20 0515  WBC 18.3*  --   --  14.0* 10.4  HGB 15.7   < >  17.3* 13.0 12.4*  HCT 44.9   < > 51.0 36.6* 35.5*  MCV 97.0  --   --  96.3 97.3  PLT 146*  --   --  109* 105*   < > = values in this interval  not displayed.   BMET Recent Labs    02/15/20 1001 02/15/20 1001 02/15/20 1102 02/15/20 1607 02/16/20 0515  NA 125*   < > 126* 120* 123*  K 2.4*   < > 3.3* 3.5 3.5  CL 69*   < > 73* 77* 83*  CO2 26  --   --  32 30  GLUCOSE 143*   < > 146* 92 82  BUN 20   < > 19 23* 24*  CREATININE 2.89*   < > 2.60* 1.95* 1.18  CALCIUM 9.2  --   --  7.7* 8.0*   < > = values in this interval not displayed.   LFT Recent Labs    02/15/20 1001 02/15/20 1607 02/16/20 0515  BILITOT 2.9* 1.5* 1.1  ALKPHOS 106 81 69  AST 69* 33 32  ALT 48* 24 21  PROT 8.6* 6.8 6.0*  ALBUMIN 3.7 3.0* 2.7*    Lipase Recent Labs    02/15/20 1001  LIPASE 73*    PT/INR No results for input(s): LABPROT, INR in the last 72 hours.    Imaging Studies: CT ABDOMEN PELVIS WO CONTRAST  Result Date: 02/15/2020 CLINICAL DATA:  Abdominal pain and intractable vomiting. EXAM: CT ABDOMEN AND PELVIS WITHOUT CONTRAST TECHNIQUE: Multidetector CT imaging of the abdomen and pelvis was performed following the standard protocol without IV contrast. COMPARISON:  None. FINDINGS: Lower chest: Patchy nodular airspace density in the lingula is identified, image 25/4. No pleural effusion. Hepatobiliary: Hepatic steatosis. No focal liver abnormality. Gallbladder is normal. No biliary ductal dilatation. Pancreas: Unremarkable. No pancreatic ductal dilatation or surrounding inflammatory changes. Spleen: Normal in size without focal abnormality. Adrenals/Urinary Tract: Normal appearance of the adrenal glands. There is no kidney mass or hydronephrosis identified bilaterally. The urinary bladder appears normal. Stomach/Bowel: Small hiatal hernia. The appendix is visualized and appears normal. The stomach is nondistended. No small bowel wall thickening, there is equivocal, mild wall thickening involving the terminal ileum without surrounding inflammation. Liquid stool identified within the colon. Vascular/Lymphatic: Aortic atherosclerosis. No  aneurysm. No abdominal or pelvic adenopathy. Reproductive: Prostate is unremarkable. Other: No free fluid or fluid collections. Musculoskeletal: Multiple remote left posterior rib fracture deformities. Lumbar degenerative disc disease. IMPRESSION: 1. Equivocal, mild wall thickening involving the terminal ileum without surrounding inflammation. Findings may reflect sequelae of inflammatory or infectious enteritis. 2. Liquid stool identified within the colon which may reflect underlying diarrheal state. 3. Hepatic steatosis. 4. Patchy nodular airspace density in the lingula is identified. Consider further evaluation with chest radiograph to assess for pneumonia. 5. Aortic atherosclerosis. Aortic Atherosclerosis (ICD10-I70.0). Electronically Signed   By: Signa Kell M.D.   On: 02/15/2020 15:48   DG CHEST PORT 1 VIEW  Result Date: 02/16/2020 CLINICAL DATA:  Cough EXAM: PORTABLE CHEST 1 VIEW COMPARISON:  None. FINDINGS: Lungs are mildly hyperexpanded. No edema or airspace opacity. Scattered areas of parenchymal lung scarring noted. Heart size and pulmonary vascular normal. No adenopathy. No bone lesions. IMPRESSION: Lungs mildly hyperexpanded with scattered areas of scarring. No edema or airspace opacity. Heart size normal. Electronically Signed   By: Bretta Bang III M.D.   On: 02/16/2020 14:07  [4 week]   Impression: 54 y/o male with history of etoh pancreatitis (2020),  recent admission 11/2019 with nonspecific elevation of lipase and abnormal duodenum on CT, ongoing etoh dependence who presents with acute onset N/V and nonbloody diarrhea occurring for the past week. Has been unable to keep anything down including etoh. Presented to ED with confusion, tremors which have improved at this time.   N/V/D: one week history. On presentation, WBC elevated (likely stress demargination and has resolved) and patient had significant electrolyte abnormalities and dehydration which have been corrected. Acute renal  failure has resolved. CT showed mild wall thickening of the ileum. ?symptoms secondary to acute gastroenteritis +/- etoh withdrawals. Cannot exclude underlying gastritis/pud. Doubt acute pancreatitis at this time. Of note, diarrhea much improved last 24 hours and vomiting has resolved.   Elevated transaminases: likely due to etoh use. Viral markers negative. Resolved. Fatty liver noted on CT. Discussed need to stop drinking and risk for progression to cirrhosis.   Plan: 1. Supportive measures.  2. Await stool pathogen panel.  3. Agree with PPI.   We would like to thank you for the opportunity to participate in the care of Travis Weeks.  Leanna Battles. Dixon Boos [facility] [phone] 10/7/20217:36 PM     LOS: 1 day

## 2020-02-17 ENCOUNTER — Telehealth: Payer: Self-pay | Admitting: Nurse Practitioner

## 2020-02-17 DIAGNOSIS — N179 Acute kidney failure, unspecified: Secondary | ICD-10-CM

## 2020-02-17 DIAGNOSIS — E876 Hypokalemia: Secondary | ICD-10-CM

## 2020-02-17 DIAGNOSIS — F10239 Alcohol dependence with withdrawal, unspecified: Secondary | ICD-10-CM

## 2020-02-17 DIAGNOSIS — F10230 Alcohol dependence with withdrawal, uncomplicated: Secondary | ICD-10-CM

## 2020-02-17 DIAGNOSIS — E871 Hypo-osmolality and hyponatremia: Secondary | ICD-10-CM

## 2020-02-17 LAB — URINE CULTURE: Culture: <10000 — AB

## 2020-02-17 LAB — BASIC METABOLIC PANEL
Anion gap: 7 (ref 5–15)
BUN: 14 mg/dL (ref 6–20)
CO2: 27 mmol/L (ref 22–32)
Calcium: 8.5 mg/dL — ABNORMAL LOW (ref 8.9–10.3)
Chloride: 93 mmol/L — ABNORMAL LOW (ref 98–111)
Creatinine, Ser: 0.8 mg/dL (ref 0.61–1.24)
GFR calc non Af Amer: >60 mL/min (ref >60)
Glucose, Bld: 78 mg/dL (ref 70–99)
Potassium: 4.5 mmol/L (ref 3.5–5.1)
Sodium: 127 mmol/L — ABNORMAL LOW (ref 135–145)

## 2020-02-17 LAB — GASTROINTESTINAL PANEL BY PCR, STOOL (REPLACES STOOL CULTURE)

## 2020-02-17 LAB — CBC
HCT: 37.5 % — ABNORMAL LOW (ref 39.0–52.0)
Hemoglobin: 12.8 g/dL — ABNORMAL LOW (ref 13.0–17.0)
MCH: 33.5 pg (ref 26.0–34.0)
MCHC: 34.1 g/dL (ref 30.0–36.0)
MCV: 98.2 fL (ref 80.0–100.0)
Platelets: 111 10*3/uL — ABNORMAL LOW (ref 150–400)
RBC: 3.82 MIL/uL — ABNORMAL LOW (ref 4.22–5.81)
RDW: 11.7 % (ref 11.5–15.5)
WBC: 8.3 10*3/uL (ref 4.0–10.5)
nRBC: 0 % (ref 0.0–0.2)

## 2020-02-17 LAB — FOLATE: Folate: 13.7 ng/mL (ref >5.9)

## 2020-02-17 LAB — MAGNESIUM: Magnesium: 1.8 mg/dL (ref 1.7–2.4)

## 2020-02-17 LAB — VITAMIN B12: Vitamin B-12: 939 pg/mL — ABNORMAL HIGH (ref 180–914)

## 2020-02-17 NOTE — Progress Notes (Signed)
Subjective: Feeling ok today. No bowel movement overnight. Diarrhea has been resolved x approximately 36 hours. No N/V last night or this morning. Denies overt abdominal pain. Tolerated diet today without issue. No other GI complaints.  Objective: Vital signs in last 24 hours: Temp:  [97.9 F (36.6 C)-98.3 F (36.8 C)] 98 F (36.7 C) (10/08 0450) Pulse Rate:  [70-92] 70 (10/08 0450) Resp:  [18] 18 (10/08 0450) BP: (109-181)/(78-98) 181/96 (10/08 0450) SpO2:  [96 %-100 %] 100 % (10/08 0450) Last BM Date: 02/16/20 General:   Alert and oriented, pleasant Eyes:  No icterus, sclera clear. Conjuctiva pink.  Heart:  S1, S2 present, no murmurs noted.  Lungs: Clear to auscultation bilaterally, without wheezing, rales, or rhonchi.  Abdomen:  Bowel sounds present, soft, non-distended. Minimal to mild mid-abdominal TTP. No HSM or hernias noted. No rebound or guarding. No masses appreciated  Msk:  Symmetrical without gross deformities. Pulses:  Normal bilateral DP pulses noted. Extremities:  Without clubbing or edema. Neurologic:  Alert and  oriented x4;  grossly normal neurologically. Skin:  Warm and dry, intact without significant lesions.  Psych:  Alert and cooperative. Normal mood and affect.  Intake/Output from previous day: 10/07 0701 - 10/08 0700 In: 1560 [P.O.:1560] Out: 3200 [Urine:3200] Intake/Output this shift: No intake/output data recorded.  Lab Results: Recent Labs    02/15/20 1607 02/16/20 0515 02/17/20 0637  WBC 14.0* 10.4 8.3  HGB 13.0 12.4* 12.8*  HCT 36.6* 35.5* 37.5*  PLT 109* 105* 111*   BMET Recent Labs    02/15/20 1001 02/15/20 1001 02/15/20 1102 02/15/20 1607 02/16/20 0515  NA 125*   < > 126* 120* 123*  K 2.4*   < > 3.3* 3.5 3.5  CL 69*   < > 73* 77* 83*  CO2 26  --   --  32 30  GLUCOSE 143*   < > 146* 92 82  BUN 20   < > 19 23* 24*  CREATININE 2.89*   < > 2.60* 1.95* 1.18  CALCIUM 9.2  --   --  7.7* 8.0*   < > = values in this interval not  displayed.   LFT Recent Labs    02/15/20 1001 02/15/20 1607 02/16/20 0515  PROT 8.6* 6.8 6.0*  ALBUMIN 3.7 3.0* 2.7*  AST 69* 33 32  ALT 48* 24 21  ALKPHOS 106 81 69  BILITOT 2.9* 1.5* 1.1   PT/INR No results for input(s): LABPROT, INR in the last 72 hours. Hepatitis Panel Recent Labs    02/15/20 1607  HEPBSAG NON REACTIVE  HCVAB NON REACTIVE     Studies/Results: CT ABDOMEN PELVIS WO CONTRAST  Result Date: 02/15/2020 CLINICAL DATA:  Abdominal pain and intractable vomiting. EXAM: CT ABDOMEN AND PELVIS WITHOUT CONTRAST TECHNIQUE: Multidetector CT imaging of the abdomen and pelvis was performed following the standard protocol without IV contrast. COMPARISON:  None. FINDINGS: Lower chest: Patchy nodular airspace density in the lingula is identified, image 25/4. No pleural effusion. Hepatobiliary: Hepatic steatosis. No focal liver abnormality. Gallbladder is normal. No biliary ductal dilatation. Pancreas: Unremarkable. No pancreatic ductal dilatation or surrounding inflammatory changes. Spleen: Normal in size without focal abnormality. Adrenals/Urinary Tract: Normal appearance of the adrenal glands. There is no kidney mass or hydronephrosis identified bilaterally. The urinary bladder appears normal. Stomach/Bowel: Small hiatal hernia. The appendix is visualized and appears normal. The stomach is nondistended. No small bowel wall thickening, there is equivocal, mild wall thickening involving the terminal ileum without surrounding inflammation. Liquid  stool identified within the colon. Vascular/Lymphatic: Aortic atherosclerosis. No aneurysm. No abdominal or pelvic adenopathy. Reproductive: Prostate is unremarkable. Other: No free fluid or fluid collections. Musculoskeletal: Multiple remote left posterior rib fracture deformities. Lumbar degenerative disc disease. IMPRESSION: 1. Equivocal, mild wall thickening involving the terminal ileum without surrounding inflammation. Findings may reflect  sequelae of inflammatory or infectious enteritis. 2. Liquid stool identified within the colon which may reflect underlying diarrheal state. 3. Hepatic steatosis. 4. Patchy nodular airspace density in the lingula is identified. Consider further evaluation with chest radiograph to assess for pneumonia. 5. Aortic atherosclerosis. Aortic Atherosclerosis (ICD10-I70.0). Electronically Signed   By: Signa Kell M.D.   On: 02/15/2020 15:48   DG CHEST PORT 1 VIEW  Result Date: 02/16/2020 CLINICAL DATA:  Cough EXAM: PORTABLE CHEST 1 VIEW COMPARISON:  None. FINDINGS: Lungs are mildly hyperexpanded. No edema or airspace opacity. Scattered areas of parenchymal lung scarring noted. Heart size and pulmonary vascular normal. No adenopathy. No bone lesions. IMPRESSION: Lungs mildly hyperexpanded with scattered areas of scarring. No edema or airspace opacity. Heart size normal. Electronically Signed   By: Bretta Bang III M.D.   On: 02/16/2020 14:07    Assessment: 54 y/o male with history of etoh pancreatitis (2020), recent admission 11/2019 with nonspecific elevation of lipase and abnormal duodenum on CT, ongoing etoh dependence who presents with acute onset N/V and nonbloody diarrhea occurring for the past week. Has been unable to keep anything down including etoh. Presented to ED with confusion, tremors which have improved at this time.   N/V/D - one week history. On presentation, WBC elevated (likely stress demargination and has resolved) and patient had significant electrolyte abnormalities and dehydration which have been corrected. Acute renal failure has resolved. CT showed mild wall thickening of the ileum. ?symptoms secondary to acute gastroenteritis +/- etoh withdrawals. Cannot exclude underlying gastritis/pud. Doubt acute pancreatitis at this time. Of note, no diarrhea in the past 24-36 hours. N/V has resolved..   Elevated transaminases - likely due to etoh use. Viral markers negative. Resolved. Fatty  liver noted on CT. Discussed need to stop drinking and risk for progression to cirrhosis.   Plan: 1. Will follow GI path panel results 2. Outpatient follow-up with [facility] ([facility]) 3. Notify [facility] of any recurrent symptoms. 4. ETOH abstinence 5. Advance to soft diet 6. Anticipate d/c in the next 24-48 hours 7. We will sign off at this time, please contact us if further assistance needed   Thank you for allowing [place] to participate in the care of [facility]  Wynne Dust, DNP, AGNP-C Adult & Gerontological Nurse Practitioner [facility]    LOS: 2 days    02/17/2020, 8:25 AM

## 2020-02-17 NOTE — Discharge Summary (Signed)
Physician Discharge Summary  Travis Weeks ZHG:[id number] DOB: Dec 18, 1965 DOA: 02/15/2020  PCP: Patient, No Pcp Per  Admit date: 02/15/2020 Discharge date: 02/18/2020  Admitted From: Home Disposition:  Home  Recommendations for Outpatient Follow-up:  1. Follow up with PCP in 1-2 weeks 2. Please obtain BMP/CBC in one week     Discharge Condition: Stable CODE STATUS:FULL Diet recommendation: Heart Healthy    Brief/Interim Summary: 54 y.o.malewith medical history ofhypertension, alcohol abuse, pancreatitis, peptic ulcer disease presenting with approximately 1 week history of nausea, vomiting, and diarrhea. The patient is currently confused. His mother at the bedside assists with the history. The patient continues to drink at least 6 beers on a daily basis. He has been drinking for nearly 30 years. According to the patient's mom there has not been any hematemesis, hematochezia, melena. The patient denies any fever, chills, headache, chest pain, shortness breath, hemoptysis. He has a nonproductive cough. He continues to smoke 1/2 pack/day. He complains of epigastric abdominal pain. Hestates that his last drink of alcohol was approximately 5 days prior to this admission. He has been taking intermittent BC powders. There is been no history of travel or eating exotic foods. The patient is unable to tell me if he has been on any antibiotics recently. He is unable to clarify how many times he has vomited or having diarrhea in the last 24 hours. In the emergency department, the patient was afebrile and hemodynamically stable. He was tachycardic with heart rate of into the 120s. Oxygen saturation was 92-95% on room air. The patient was notably tremulous and started on Ativan with improvement. Sodium 125, potassium 2.4, serum creatinine 2.89. AST 69, ALT 48, alk phosphatase 106, total bilirubin 2.6. WBC 18.3, hemoglobin 15.7, platelets 146,000  Discharge Diagnoses:  Acute kidney  injury -Secondary to volume depletion and poor solute intake -ContinueIV fluids-->improving -Baseline creatinine 0.5-0.6 -Presented with serum creatinine 2.89 -serum creatinine 0.77 on the day of d/c  Intractable nausea and vomiting/??Ileitis -Secondary to alcoholic gastritis and peptic ulcer disease -lipase 73 -10/6CT abd/pelvis--mild thickening of TI without surrounding inflammation ;patcy nodular lingular opacity -continueProtonix -UA--np pyuria -GI consult appreciated-->conservative management, follow stool studies which were neg -initially placed on bowel rest.  Gradually advanced diet which he tolerated  Alcohol withdrawal -ContinueCIWA protocol -mental status improving with ativan -back to baseline at time of dc  Hyponatremia -Secondary toAKIvolume depletion and poor solute intake -continueIV fluids-->improved -chronic dating back to 08/2016-->127-129 -Na 127 at time of d/c/  Hypokalemia -Repleted -Check magnesium--1.9  Diarrhea -Stool for C. Difficile--neg -Stool pathogen panel--neg  Essential hypertension -Holding lisinopril in the setting of AKI and dehydration--plan to restart after d/c  Transaminasemia -Secondary to alcohol use -Hepatitis B surface antigen--neg -hepatitis C antibody--neg  Leukocytosis -Likely stress demargination -UA --no pyuria -personally reviewedCXR--hyperexpanded, no consolidation -PCT 0.28 -remain off abx for now -improved-->8.3  Thrombocytopenia -due to Etoh -check B12--939 -folate--13.7 -no hepatosplenomegaly on CT  Hypertension -start amlodipine -follow up PCP   Discharge Instructions   Allergies as of 02/18/2020   No Known Allergies     Medication List    STOP taking these medications   famotidine 20 MG tablet Commonly known as: PEPCID   lisinopril 20 MG tablet Commonly known as: ZESTRIL     TAKE these medications   amLODipine 5 MG tablet Commonly known as: NORVASC Take 1 tablet  (5 mg total) by mouth daily.   folic acid 1 MG tablet Commonly known as: FOLVITE Take 1 mg by mouth daily.   pantoprazole  40 MG tablet Commonly known as: PROTONIX Take 1 tablet (40 mg total) by mouth daily.       No Known Allergies  Consultations:  GI   Procedures/Studies: CT ABDOMEN PELVIS WO CONTRAST  Result Date: 02/15/2020 CLINICAL DATA:  Abdominal pain and intractable vomiting. EXAM: CT ABDOMEN AND PELVIS WITHOUT CONTRAST TECHNIQUE: Multidetector CT imaging of the abdomen and pelvis was performed following the standard protocol without IV contrast. COMPARISON:  None. FINDINGS: Lower chest: Patchy nodular airspace density in the lingula is identified, image 25/4. No pleural effusion. Hepatobiliary: Hepatic steatosis. No focal liver abnormality. Gallbladder is normal. No biliary ductal dilatation. Pancreas: Unremarkable. No pancreatic ductal dilatation or surrounding inflammatory changes. Spleen: Normal in size without focal abnormality. Adrenals/Urinary Tract: Normal appearance of the adrenal glands. There is no kidney mass or hydronephrosis identified bilaterally. The urinary bladder appears normal. Stomach/Bowel: Small hiatal hernia. The appendix is visualized and appears normal. The stomach is nondistended. No small bowel wall thickening, there is equivocal, mild wall thickening involving the terminal ileum without surrounding inflammation. Liquid stool identified within the colon. Vascular/Lymphatic: Aortic atherosclerosis. No aneurysm. No abdominal or pelvic adenopathy. Reproductive: Prostate is unremarkable. Other: No free fluid or fluid collections. Musculoskeletal: Multiple remote left posterior rib fracture deformities. Lumbar degenerative disc disease. IMPRESSION: 1. Equivocal, mild wall thickening involving the terminal ileum without surrounding inflammation. Findings may reflect sequelae of inflammatory or infectious enteritis. 2. Liquid stool identified within the colon which  may reflect underlying diarrheal state. 3. Hepatic steatosis. 4. Patchy nodular airspace density in the lingula is identified. Consider further evaluation with chest radiograph to assess for pneumonia. 5. Aortic atherosclerosis. Aortic Atherosclerosis (ICD10-I70.0). Electronically Signed   By: Kerby Moors M.D.   On: 02/15/2020 15:48   DG CHEST PORT 1 VIEW  Result Date: 02/16/2020 CLINICAL DATA:  Cough EXAM: PORTABLE CHEST 1 VIEW COMPARISON:  None. FINDINGS: Lungs are mildly hyperexpanded. No edema or airspace opacity. Scattered areas of parenchymal lung scarring noted. Heart size and pulmonary vascular normal. No adenopathy. No bone lesions. IMPRESSION: Lungs mildly hyperexpanded with scattered areas of scarring. No edema or airspace opacity. Heart size normal. Electronically Signed   By: Lowella Grip III M.D.   On: 02/16/2020 14:07        Discharge Exam: Vitals:   02/17/20 1954 02/18/20 0652  BP: (!) 162/95 (!) 177/91  Pulse: 81 72  Resp: 18 18  Temp: 98.4 F (36.9 C) 99.2 F (37.3 C)  SpO2: 99% 100%   Vitals:   02/17/20 0828 02/17/20 1356 02/17/20 1954 02/18/20 0652  BP: (!) 166/100 (!) 131/93 (!) 162/95 (!) 177/91  Pulse: 72 94 81 72  Resp: _0 Temp: 98.2 F (36.8 C) 98.2 F (36.8 C) 98.4 F (36.9 C) 99.2 F (37.3 C)  TempSrc: Oral Oral Oral Oral  SpO2: 100% 99% 99% 100%  Weight:      Height:        General: Pt is alert, awake, not in acute distress Cardiovascular: RRR, S1/S2 +, no rubs, no gallops Respiratory: CTA bilaterally, no wheezing, no rhonchi Abdominal: Soft, NT, ND, bowel sounds + Extremities: no edema, no cyanosis   The results of significant diagnostics from this hospitalization (including imaging, microbiology, ancillary and laboratory) are listed below for reference.    Significant Diagnostic Studies: CT ABDOMEN PELVIS WO CONTRAST  Result Date: 02/15/2020 CLINICAL DATA:  Abdominal pain and intractable vomiting. EXAM: CT ABDOMEN AND  PELVIS WITHOUT CONTRAST TECHNIQUE: Multidetector CT imaging of the abdomen  and pelvis was performed following the standard protocol without IV contrast. COMPARISON:  None. FINDINGS: Lower chest: Patchy nodular airspace density in the lingula is identified, image 25/4. No pleural effusion. Hepatobiliary: Hepatic steatosis. No focal liver abnormality. Gallbladder is normal. No biliary ductal dilatation. Pancreas: Unremarkable. No pancreatic ductal dilatation or surrounding inflammatory changes. Spleen: Normal in size without focal abnormality. Adrenals/Urinary Tract: Normal appearance of the adrenal glands. There is no kidney mass or hydronephrosis identified bilaterally. The urinary bladder appears normal. Stomach/Bowel: Small hiatal hernia. The appendix is visualized and appears normal. The stomach is nondistended. No small bowel wall thickening, there is equivocal, mild wall thickening involving the terminal ileum without surrounding inflammation. Liquid stool identified within the colon. Vascular/Lymphatic: Aortic atherosclerosis. No aneurysm. No abdominal or pelvic adenopathy. Reproductive: Prostate is unremarkable. Other: No free fluid or fluid collections. Musculoskeletal: Multiple remote left posterior rib fracture deformities. Lumbar degenerative disc disease. IMPRESSION: 1. Equivocal, mild wall thickening involving the terminal ileum without surrounding inflammation. Findings may reflect sequelae of inflammatory or infectious enteritis. 2. Liquid stool identified within the colon which may reflect underlying diarrheal state. 3. Hepatic steatosis. 4. Patchy nodular airspace density in the lingula is identified. Consider further evaluation with chest radiograph to assess for pneumonia. 5. Aortic atherosclerosis. Aortic Atherosclerosis (ICD10-I70.0). Electronically Signed   By: Kerby Moors M.D.   On: 02/15/2020 15:48   DG CHEST PORT 1 VIEW  Result Date: 02/16/2020 CLINICAL DATA:  Cough EXAM: PORTABLE  CHEST 1 VIEW COMPARISON:  None. FINDINGS: Lungs are mildly hyperexpanded. No edema or airspace opacity. Scattered areas of parenchymal lung scarring noted. Heart size and pulmonary vascular normal. No adenopathy. No bone lesions. IMPRESSION: Lungs mildly hyperexpanded with scattered areas of scarring. No edema or airspace opacity. Heart size normal. Electronically Signed   By: Lowella Grip III M.D.   On: 02/16/2020 14:07     Microbiology: Recent Results (from the past 240 hour(s))  Respiratory Panel by RT PCR (Flu A&B, Covid) - Nasopharyngeal Swab     Status: None   Collection Time: 02/15/20 10:01 AM   Specimen: Nasopharyngeal Swab  Result Value Ref Range Status   SARS Coronavirus 2 by RT PCR NEGATIVE NEGATIVE Final    Comment: (NOTE) SARS-CoV-2 target nucleic acids are NOT DETECTED.  The SARS-CoV-2 RNA is generally detectable in upper respiratoy specimens during the acute phase of infection. The lowest concentration of SARS-CoV-2 viral copies this assay can detect is 131 copies/mL. A negative result does not preclude SARS-Cov-2 infection and should not be used as the sole basis for treatment or other patient management decisions. A negative result may occur with  improper specimen collection/handling, submission of specimen other than nasopharyngeal swab, presence of viral mutation(s) within the areas targeted by this assay, and inadequate number of viral copies (<131 copies/mL). A negative result must be combined with clinical observations, patient history, and epidemiological information. The expected result is Negative.  Fact Sheet for Patients:  PinkCheek.be  Fact Sheet for Healthcare Providers:  GravelBags.it  This test is no t yet approved or cleared by the [place] FDA and  has been authorized for detection and/or diagnosis of SARS-CoV-2 by FDA under an Emergency Use Authorization (EUA). This EUA will remain   in effect (meaning this test can be used) for the duration of the COVID-19 declaration under Section 564(b)(1) of the Act, 21 U.S.C. section 360bbb-3(b)(1), unless the authorization is terminated or revoked sooner.     Influenza A by PCR NEGATIVE NEGATIVE Final  Influenza B by PCR NEGATIVE NEGATIVE Final    Comment: (NOTE) The Xpert Xpress SARS-CoV-2/FLU/RSV assay is intended as an aid in  the diagnosis of influenza from Nasopharyngeal swab specimens and  should not be used as a sole basis for treatment. Nasal washings and  aspirates are unacceptable for Xpert Xpress SARS-CoV-2/FLU/RSV  testing.  Fact Sheet for Patients: PinkCheek.be  Fact Sheet for Healthcare Providers: GravelBags.it  This test is not yet approved or cleared by the [place] FDA and  has been authorized for detection and/or diagnosis of SARS-CoV-2 by  FDA under an Emergency Use Authorization (EUA). This EUA will remain  in effect (meaning this test can be used) for the duration of the  Covid-19 declaration under Section 564(b)(1) of the Act, 21  U.S.C. section 360bbb-3(b)(1), unless the authorization is  terminated or revoked. Performed at [facility], [address] [place], [place] [address]   Culture, Urine     Status: Abnormal   Collection Time: 02/15/20 12:48 PM   Specimen: Urine, Random  Result Value Ref Range Status   Specimen Description   Final    URINE, RANDOM Performed at [facility], [address] [place], [place] [address]    Special Requests   Final    NONE Performed at [facility], [address] [place], [place] [address]    Culture (A)  Final    <10,000 COLONIES/mL INSIGNIFICANT GROWTH Performed at [facility] [address], [place], [place] [address]    Report Status 02/17/2020 FINAL  Final  C Difficile Quick Screen w PCR reflex     Status: None   Collection Time: 02/16/20 12:00 PM   Specimen: STOOL   Result Value Ref Range Status   C Diff antigen NEGATIVE NEGATIVE Final   C Diff toxin NEGATIVE NEGATIVE Final   C Diff interpretation No C. difficile detected.  Final    Comment: Performed at [facility], [address], [place], [place] [address]  Gastrointestinal Panel by PCR , Stool     Status: None   Collection Time: 02/16/20 12:00 PM   Specimen: STOOL  Result Value Ref Range Status   Campylobacter species NOT DETECTED NOT DETECTED Final   Plesimonas shigelloides NOT DETECTED NOT DETECTED Final   Salmonella species NOT DETECTED NOT DETECTED Final   Yersinia enterocolitica NOT DETECTED NOT DETECTED Final   Vibrio species NOT DETECTED NOT DETECTED Final   Vibrio cholerae NOT DETECTED NOT DETECTED Final   Enteroaggregative E coli (EAEC) NOT DETECTED NOT DETECTED Final   Enteropathogenic E coli (EPEC) NOT DETECTED NOT DETECTED Final   Enterotoxigenic E coli (ETEC) NOT DETECTED NOT DETECTED Final   Shiga like toxin producing E coli (STEC) NOT DETECTED NOT DETECTED Final   Shigella/Enteroinvasive E coli (EIEC) NOT DETECTED NOT DETECTED Final   Cryptosporidium NOT DETECTED NOT DETECTED Final   Cyclospora cayetanensis NOT DETECTED NOT DETECTED Final   Entamoeba histolytica NOT DETECTED NOT DETECTED Final   Giardia lamblia NOT DETECTED NOT DETECTED Final   Adenovirus F40/41 NOT DETECTED NOT DETECTED Final   Astrovirus NOT DETECTED NOT DETECTED Final   Norovirus GI/GII NOT DETECTED NOT DETECTED Final   Rotavirus A NOT DETECTED NOT DETECTED Final   Sapovirus (I, II, IV, and V) NOT DETECTED NOT DETECTED Final    Comment: Performed at [facility], [address], [place], [place] [address]     Labs: Basic Metabolic Panel: Recent Labs  Lab 02/15/20 1001 02/15/20 1001 02/15/20 1102 02/15/20 1102  02/15/20 1607 02/15/20 1607 02/16/20 0515 02/16/20 0515 02/17/20 0637 02/18/20 0614  NA 125*   < > 126*  --  120*  --  123*  --  127* 127*  K 2.4*   < > 3.3*   < > 3.5   < >  3.5   < > 4.5 4.0  CL 69*   < > 73*  --  77*  --  83*  --  93* 94*  CO2 26  --   --   --  32  --  30  --  27 25  GLUCOSE 143*   < > 146*  --  92  --  82  --  78 86  BUN 20   < > 19  --  23*  --  24*  --  14 11  CREATININE 2.89*   < > 2.60*  --  1.95*  --  1.18  --  0.80 0.77  CALCIUM 9.2  --   --   --  7.7*  --  8.0*  --  8.5* 8.7*  MG  --   --   --   --  1.8  --  1.9  --  1.8 1.6*  PHOS  --   --   --   --  3.7  --   --   --   --   --    < > = values in this interval not displayed.   Liver Function Tests: Recent Labs  Lab 02/15/20 1001 02/15/20 1607 02/16/20 0515  AST 69* 33 32  ALT 48* 24 21  ALKPHOS 106 81 69  BILITOT 2.9* 1.5* 1.1  PROT 8.6* 6.8 6.0*  ALBUMIN 3.7 3.0* 2.7*   Recent Labs  Lab 02/15/20 1001  LIPASE 73*   No results for input(s): AMMONIA in the last 168 hours. CBC: Recent Labs  Lab 02/15/20 1001 02/15/20 1102 02/15/20 1607 02/16/20 0515 02/17/20 0637  WBC 18.3*  --  14.0* 10.4 8.3  NEUTROABS 16.1*  --   --   --   --   HGB 15.7 17.3* 13.0 12.4* 12.8*  HCT 44.9 51.0 36.6* 35.5* 37.5*  MCV 97.0  --  96.3 97.3 98.2  PLT 146*  --  109* 105* 111*   Cardiac Enzymes: No results for input(s): CKTOTAL, CKMB, CKMBINDEX, TROPONINI in the last 168 hours. BNP: Invalid input(s): POCBNP CBG: No results for input(s): GLUCAP in the last 168 hours.  Time coordinating discharge:  36 minutes  Signed:  Orson Eva, DO [facility] Pager: [phone] 02/18/2020, 12:15 PM

## 2020-02-17 NOTE — Progress Notes (Signed)
PROGRESS NOTE  Abeer Iversen ZDG:[id number] DOB: 01-08-1966 DOA: 02/15/2020 PCP: Patient, No Pcp Per  Brief History:  54 y.o.malewith medical history ofhypertension, alcohol abuse, pancreatitis, peptic ulcer disease presenting with approximately 1 week history of nausea, vomiting, and diarrhea. The patient is currently confused. His mother at the bedside assists with the history. The patient continues to drink at least 6 beers on a daily basis. He has been drinking for nearly 30 years. According to the patient's mom there has not been any hematemesis, hematochezia, melena. The patient denies any fever, chills, headache, chest pain, shortness breath, hemoptysis. He has a nonproductive cough. He continues to smoke 1/2 pack/day. He complains of epigastric abdominal pain. Hestates that his last drink of alcohol was approximately 5 days prior to this admission. He has been taking intermittent BC powders. There is been no history of travel or eating exotic foods. The patient is unable to tell me if he has been on any antibiotics recently. He is unable to clarify how many times he has vomited or having diarrhea in the last 24 hours. In the emergency department, the patient was afebrile and hemodynamically stable. He was tachycardic with heart rate of into the 120s. Oxygen saturation was 92-95% on room air. The patient was notably tremulous and started on Ativan with improvement. Sodium 125, potassium 2.4, serum creatinine 2.89. AST 69, ALT 48, alk phosphatase 106, total bilirubin 2.6. WBC 18.3, hemoglobin 15.7, platelets 146,000  Assessment/Plan: Acute kidney injury -Secondary to volume depletion and poor solute intake -Continue IV fluids-->improving -Baseline creatinine 0.5-0.6 -Presented with serum creatinine 2.89  Intractable nausea and vomiting/??Ileitis -Secondary to alcoholic gastritis and peptic ulcer disease -lipase 73 -10/6 CT abd/pelvis--mild thickening of TI  without surrounding inflammation ;patcy nodular lingular opacity  -continue Protonix -UA--np pyuria -GI consult appreciated-->conservative management, follow stool studies  Alcohol withdrawal -ContinueCIWA protocol -mental status improving with ativan  Hyponatremia -Secondary to AKI volume depletion and poor solute intake -continue IV fluids  Hypokalemia -Repleted -Check magnesium--1.9  Diarrhea -Stool for C. Difficile--neg -Stool pathogen panel--pending  Essential hypertension -Holding lisinopril in the setting of AKI and dehydration  Transaminasemia -Secondary to alcohol use -Hepatitis B surface antigen--neg -hepatitis C antibody--neg  Leukocytosis -Likely stress demargination -UA and urine culture -personally reviewed CXR--hyperexpanded, no consolidation -PCT 0.28 -remain off abx for now  Thrombocytopenia -due to Etoh -check B12--939 -folate--13.7 -no hepatosplenomegaly on CT       Status is: Inpatient  Remains inpatient appropriate because:Altered mental status; continues IVF at 125cc/hr   Dispo: The patient is from: Home  Anticipated d/c is to: Home  Anticipated d/c date is: 1-2 days  Patient currently is not medically stable to d/c.        Family Communication:   Mother updated 10/8  Consultants:  GI  Code Status:   DNR  DVT Prophylaxis:   Heparin    Procedures: As Listed in Progress Note Above  Antibiotics: None      Subjective: Patient denies fevers, chills, headache, chest pain, dyspnea, nausea, vomiting, abdominal pain, dysuria, hematuria, hematochezia, and melena.  Still having loose stools, but now with more substance in stools   Objective: Vitals:   02/16/20 1300 02/16/20 2014 02/17/20 0450 02/17/20 0828  BP: 109/78 (!) 164/98 (!) 181/96 (!) 166/100  Pulse: 92 76 70 72  Resp: $Remo'18 18 18 18  'OuMti$ Temp: 98.3 F (36.8 C) 97.9 F (36.6 C) 98 F (36.7  C) 98.2 F (36.8 C)  TempSrc: Oral Oral Oral Oral  SpO2: 96% 98% 100% 100%  Weight:      Height:        Intake/Output Summary (Last 24 hours) at 02/17/2020 1052 Last data filed at 02/17/2020 0900 Gross per 24 hour  Intake 1320 ml  Output 3400 ml  Net -2080 ml   Weight change:  Exam:   General:  Pt is alert, follows commands appropriately, not in acute distress  HEENT: No icterus, No thrush, No neck mass, /AT  Cardiovascular: RRR, S1/S2, no rubs, no gallops  Respiratory: bibasilar crackles.  No wheeze  Abdomen: Soft/+BS, non tender, non distended, no guarding  Extremities: No edema, No lymphangitis, No petechiae, No rashes, no synovitis   Data Reviewed: I have personally reviewed following labs and imaging studies Basic Metabolic Panel: Recent Labs  Lab 02/15/20 1001 02/15/20 1102 02/15/20 1607 02/16/20 0515 02/17/20 0637  NA 125* 126* 120* 123* 127*  K 2.4* 3.3* 3.5 3.5 4.5  CL 69* 73* 77* 83* 93*  CO2 26  --  32 30 27  GLUCOSE 143* 146* 92 82 78  BUN 20 19 23* 24* 14  CREATININE 2.89* 2.60* 1.95* 1.18 0.80  CALCIUM 9.2  --  7.7* 8.0* 8.5*  MG  --   --  1.8 1.9 1.8  PHOS  --   --  3.7  --   --    Liver Function Tests: Recent Labs  Lab 02/15/20 1001 02/15/20 1607 02/16/20 0515  AST 69* 33 32  ALT 48* 24 21  ALKPHOS 106 81 69  BILITOT 2.9* 1.5* 1.1  PROT 8.6* 6.8 6.0*  ALBUMIN 3.7 3.0* 2.7*   Recent Labs  Lab 02/15/20 1001  LIPASE 73*   No results for input(s): AMMONIA in the last 168 hours. Coagulation Profile: No results for input(s): INR, PROTIME in the last 168 hours. CBC: Recent Labs  Lab 02/15/20 1001 02/15/20 1102 02/15/20 1607 02/16/20 0515 02/17/20 0637  WBC 18.3*  --  14.0* 10.4 8.3  NEUTROABS 16.1*  --   --   --   --   HGB 15.7 17.3* 13.0 12.4* 12.8*  HCT 44.9 51.0 36.6* 35.5* 37.5*  MCV 97.0  --  96.3 97.3 98.2  PLT 146*  --  109* 105* 111*   Cardiac Enzymes: No results for input(s): CKTOTAL, CKMB, CKMBINDEX,  TROPONINI in the last 168 hours. BNP: Invalid input(s): POCBNP CBG: No results for input(s): GLUCAP in the last 168 hours. HbA1C: Recent Labs    02/15/20 1001  HGBA1C 4.6*   Urine analysis:    Component Value Date/Time   COLORURINE YELLOW 02/16/2020 1200   APPEARANCEUR CLEAR 02/16/2020 1200   LABSPEC 1.005 02/16/2020 1200   PHURINE 8.0 02/16/2020 1200   GLUCOSEU NEGATIVE 02/16/2020 1200   HGBUR NEGATIVE 02/16/2020 1200   BILIRUBINUR NEGATIVE 02/16/2020 1200   KETONESUR NEGATIVE 02/16/2020 1200   PROTEINUR NEGATIVE 02/16/2020 1200   NITRITE NEGATIVE 02/16/2020 1200   LEUKOCYTESUR NEGATIVE 02/16/2020 1200   Sepsis Labs: $RemoveBefo'@[email or web address]'[email or web address]$ (procalcitonin:4,lacticidven:4) ) Recent Results (from the past 240 hour(s))  Respiratory Panel by RT PCR (Flu A&B, Covid) - Nasopharyngeal Swab     Status: None   Collection Time: 02/15/20 10:01 AM   Specimen: Nasopharyngeal Swab  Result Value Ref Range Status   SARS Coronavirus 2 by RT PCR NEGATIVE NEGATIVE Final    Comment: (NOTE) SARS-CoV-2 target nucleic acids are NOT DETECTED.  The SARS-CoV-2 RNA is generally detectable in upper respiratoy specimens during the acute phase of infection. The  lowest concentration of SARS-CoV-2 viral copies this assay can detect is 131 copies/mL. A negative result does not preclude SARS-Cov-2 infection and should not be used as the sole basis for treatment or other patient management decisions. A negative result may occur with  improper specimen collection/handling, submission of specimen other than nasopharyngeal swab, presence of viral mutation(s) within the areas targeted by this assay, and inadequate number of viral copies (<131 copies/mL). A negative result must be combined with clinical observations, patient history, and epidemiological information. The expected result is Negative.  Fact Sheet for Patients:  PinkCheek.be  Fact Sheet for Healthcare Providers:    GravelBags.it  This test is no t yet approved or cleared by the [place] FDA and  has been authorized for detection and/or diagnosis of SARS-CoV-2 by FDA under an Emergency Use Authorization (EUA). This EUA will remain  in effect (meaning this test can be used) for the duration of the COVID-19 declaration under Section 564(b)(1) of the Act, 21 U.S.C. section 360bbb-3(b)(1), unless the authorization is terminated or revoked sooner.     Influenza A by PCR NEGATIVE NEGATIVE Final   Influenza B by PCR NEGATIVE NEGATIVE Final    Comment: (NOTE) The Xpert Xpress SARS-CoV-2/FLU/RSV assay is intended as an aid in  the diagnosis of influenza from Nasopharyngeal swab specimens and  should not be used as a sole basis for treatment. Nasal washings and  aspirates are unacceptable for Xpert Xpress SARS-CoV-2/FLU/RSV  testing.  Fact Sheet for Patients: PinkCheek.be  Fact Sheet for Healthcare Providers: GravelBags.it  This test is not yet approved or cleared by the [place] FDA and  has been authorized for detection and/or diagnosis of SARS-CoV-2 by  FDA under an Emergency Use Authorization (EUA). This EUA will remain  in effect (meaning this test can be used) for the duration of the  Covid-19 declaration under Section 564(b)(1) of the Act, 21  U.S.C. section 360bbb-3(b)(1), unless the authorization is  terminated or revoked. Performed at [facility], [address], [place], [place] [address]   C Difficile Quick Screen w PCR reflex     Status: None   Collection Time: 02/16/20 12:00 PM   Specimen: STOOL  Result Value Ref Range Status   C Diff antigen NEGATIVE NEGATIVE Final   C Diff toxin NEGATIVE NEGATIVE Final   C Diff interpretation No C. difficile detected.  Final    Comment: Performed at [facility], [address], [place],  [address]     Scheduled Meds: . folic acid  1 mg Oral  Daily  . heparin  5,000 Units Subcutaneous Q8H  . LORazepam  0-4 mg Intravenous Q12H   Or  . LORazepam  0-4 mg Oral Q12H  . LORazepam  2 mg Intravenous Once  . multivitamin with minerals  1 tablet Oral Daily  . pantoprazole  40 mg Oral Daily  . thiamine  100 mg Oral Daily   Or  . thiamine  100 mg Intravenous Daily   Continuous Infusions: . 0.9 % NaCl with KCl 40 mEq / L 125 mL/hr at 02/17/20 0230    Procedures/Studies: CT ABDOMEN PELVIS WO CONTRAST  Result Date: 02/15/2020 CLINICAL DATA:  Abdominal pain and intractable vomiting. EXAM: CT ABDOMEN AND PELVIS WITHOUT CONTRAST TECHNIQUE: Multidetector CT imaging of the abdomen and pelvis was performed following the standard protocol without IV contrast. COMPARISON:  None. FINDINGS: Lower chest: Patchy nodular airspace density in the lingula is identified, image 25/4. No pleural effusion. Hepatobiliary: Hepatic steatosis. No focal  liver abnormality. Gallbladder is normal. No biliary ductal dilatation. Pancreas: Unremarkable. No pancreatic ductal dilatation or surrounding inflammatory changes. Spleen: Normal in size without focal abnormality. Adrenals/Urinary Tract: Normal appearance of the adrenal glands. There is no kidney mass or hydronephrosis identified bilaterally. The urinary bladder appears normal. Stomach/Bowel: Small hiatal hernia. The appendix is visualized and appears normal. The stomach is nondistended. No small bowel wall thickening, there is equivocal, mild wall thickening involving the terminal ileum without surrounding inflammation. Liquid stool identified within the colon. Vascular/Lymphatic: Aortic atherosclerosis. No aneurysm. No abdominal or pelvic adenopathy. Reproductive: Prostate is unremarkable. Other: No free fluid or fluid collections. Musculoskeletal: Multiple remote left posterior rib fracture deformities. Lumbar degenerative disc disease. IMPRESSION: 1. Equivocal, mild wall thickening involving the terminal ileum without  surrounding inflammation. Findings may reflect sequelae of inflammatory or infectious enteritis. 2. Liquid stool identified within the colon which may reflect underlying diarrheal state. 3. Hepatic steatosis. 4. Patchy nodular airspace density in the lingula is identified. Consider further evaluation with chest radiograph to assess for pneumonia. 5. Aortic atherosclerosis. Aortic Atherosclerosis (ICD10-I70.0). Electronically Signed   By: Kerby Moors M.D.   On: 02/15/2020 15:48   DG CHEST PORT 1 VIEW  Result Date: 02/16/2020 CLINICAL DATA:  Cough EXAM: PORTABLE CHEST 1 VIEW COMPARISON:  None. FINDINGS: Lungs are mildly hyperexpanded. No edema or airspace opacity. Scattered areas of parenchymal lung scarring noted. Heart size and pulmonary vascular normal. No adenopathy. No bone lesions. IMPRESSION: Lungs mildly hyperexpanded with scattered areas of scarring. No edema or airspace opacity. Heart size normal. Electronically Signed   By: Lowella Grip III M.D.   On: 02/16/2020 14:07    Orson Eva, DO  [facility]  If 7PM-7AM, please contact night-coverage [email or web address] Password [email or web address] 02/17/2020, 10:52 AM   LOS: 2 days

## 2020-02-17 NOTE — Telephone Encounter (Signed)
Please schedule patient for hospital follow-up in 6-8 weeks for "hosp f/u, ileitis, elevated LFTs"

## 2020-02-18 LAB — BASIC METABOLIC PANEL
Anion gap: 8 (ref 5–15)
BUN: 11 mg/dL (ref 6–20)
CO2: 25 mmol/L (ref 22–32)
Calcium: 8.7 mg/dL — ABNORMAL LOW (ref 8.9–10.3)
Chloride: 94 mmol/L — ABNORMAL LOW (ref 98–111)
Creatinine, Ser: 0.77 mg/dL (ref 0.61–1.24)
GFR, Estimated: >60 mL/min (ref >60)
Glucose, Bld: 86 mg/dL (ref 70–99)
Potassium: 4 mmol/L (ref 3.5–5.1)
Sodium: 127 mmol/L — ABNORMAL LOW (ref 135–145)

## 2020-02-18 LAB — MAGNESIUM: Magnesium: 1.6 mg/dL — ABNORMAL LOW (ref 1.7–2.4)

## 2020-02-18 MED ORDER — AMLODIPINE BESYLATE 5 MG PO TABS
5.0000 mg | ORAL_TABLET | Freq: Every day | ORAL | Status: DC
  Administered 2020-02-18: 5 mg via ORAL
  Filled 2020-02-18: qty 1

## 2020-02-18 MED ORDER — PANTOPRAZOLE SODIUM 40 MG PO TBEC: 40.0000 mg | DELAYED_RELEASE_TABLET | Freq: Every day | ORAL | 1 refills | Status: DC

## 2020-02-18 MED ORDER — AMLODIPINE BESYLATE 5 MG PO TABS
5.0000 mg | ORAL_TABLET | Freq: Every day | ORAL | 1 refills | Status: DC
Start: 2020-02-18 — End: 2024-01-31

## 2020-02-18 NOTE — Progress Notes (Signed)
Nsg Discharge Note  Admit Date:  02/15/2020 Discharge date: 02/18/2020   Travis Weeks to be D/C'd Home per MD order.  AVS completed.  Copy for chart, and copy for patient signed, and dated. Patient/caregiver able to verbalize understanding. IV removed. Discharge paper work given and reviewed.   Discharge Medication: Allergies as of 02/18/2020   No Known Allergies     Medication List    STOP taking these medications   famotidine 20 MG tablet Commonly known as: PEPCID   lisinopril 20 MG tablet Commonly known as: ZESTRIL     TAKE these medications   amLODipine 5 MG tablet Commonly known as: NORVASC Take 1 tablet (5 mg total) by mouth daily.   folic acid 1 MG tablet Commonly known as: FOLVITE Take 1 mg by mouth daily.   pantoprazole 40 MG tablet Commonly known as: PROTONIX Take 1 tablet (40 mg total) by mouth daily.       Discharge Assessment: Vitals:   02/18/20 0652 02/18/20 1252  BP: (!) 177/91 (!) 145/88  Pulse: 72 84  Resp: 18   Temp: 99.2 F (37.3 C)   SpO2: 100%    Skin clean, dry and intact without evidence of skin break down, no evidence of skin tears noted. IV catheter discontinued intact. Site without signs and symptoms of complications - no redness or edema noted at insertion site, patient denies c/o pain - only slight tenderness at site.  Dressing with slight pressure applied.  D/c Instructions-Education: Discharge instructions given to patient/family with verbalized understanding. D/c education completed with patient/family including follow up instructions, medication list, d/c activities limitations if indicated, with other d/c instructions as indicated by MD - patient able to verbalize understanding, all questions fully answered. Patient instructed to return to ED, call 911, or call MD for any changes in condition.  Patient escorted via WC, and D/C home via private auto.  Lonn Georgia, RN 02/18/2020 1:11 PM

## 2020-02-20 ENCOUNTER — Encounter: Payer: Self-pay | Admitting: Internal Medicine

## 2020-02-20 NOTE — Telephone Encounter (Signed)
Patient scheduled.

## 2020-04-02 ENCOUNTER — Ambulatory Visit: Payer: Self-pay | Admitting: Gastroenterology

## 2020-05-22 ENCOUNTER — Ambulatory Visit: Payer: Self-pay | Admitting: Gastroenterology

## 2021-05-31 DIAGNOSIS — H6120 Impacted cerumen, unspecified ear: Secondary | ICD-10-CM | POA: Diagnosis not present

## 2021-05-31 DIAGNOSIS — F1029 Alcohol dependence with unspecified alcohol-induced disorder: Secondary | ICD-10-CM | POA: Diagnosis not present

## 2021-05-31 DIAGNOSIS — R918 Other nonspecific abnormal finding of lung field: Secondary | ICD-10-CM | POA: Diagnosis not present

## 2021-05-31 DIAGNOSIS — Z1159 Encounter for screening for other viral diseases: Secondary | ICD-10-CM | POA: Diagnosis not present

## 2021-05-31 DIAGNOSIS — G629 Polyneuropathy, unspecified: Secondary | ICD-10-CM | POA: Diagnosis not present

## 2021-05-31 DIAGNOSIS — E871 Hypo-osmolality and hyponatremia: Secondary | ICD-10-CM | POA: Diagnosis not present

## 2021-05-31 DIAGNOSIS — Z8781 Personal history of (healed) traumatic fracture: Secondary | ICD-10-CM | POA: Diagnosis not present

## 2021-05-31 DIAGNOSIS — I10 Essential (primary) hypertension: Secondary | ICD-10-CM | POA: Diagnosis not present

## 2021-05-31 DIAGNOSIS — Z8719 Personal history of other diseases of the digestive system: Secondary | ICD-10-CM | POA: Diagnosis not present

## 2021-05-31 DIAGNOSIS — Z13220 Encounter for screening for lipoid disorders: Secondary | ICD-10-CM | POA: Diagnosis not present

## 2021-05-31 DIAGNOSIS — R5383 Other fatigue: Secondary | ICD-10-CM | POA: Diagnosis not present

## 2021-05-31 DIAGNOSIS — Z8669 Personal history of other diseases of the nervous system and sense organs: Secondary | ICD-10-CM | POA: Diagnosis not present

## 2021-06-28 DIAGNOSIS — R42 Dizziness and giddiness: Secondary | ICD-10-CM | POA: Diagnosis not present

## 2021-06-28 DIAGNOSIS — E871 Hypo-osmolality and hyponatremia: Secondary | ICD-10-CM | POA: Diagnosis not present

## 2021-06-28 DIAGNOSIS — R918 Other nonspecific abnormal finding of lung field: Secondary | ICD-10-CM | POA: Diagnosis not present

## 2021-06-28 DIAGNOSIS — M7732 Calcaneal spur, left foot: Secondary | ICD-10-CM | POA: Diagnosis not present

## 2021-06-28 DIAGNOSIS — I10 Essential (primary) hypertension: Secondary | ICD-10-CM | POA: Diagnosis not present

## 2021-09-16 DIAGNOSIS — E871 Hypo-osmolality and hyponatremia: Secondary | ICD-10-CM | POA: Diagnosis not present

## 2021-09-16 DIAGNOSIS — Z20822 Contact with and (suspected) exposure to covid-19: Secondary | ICD-10-CM | POA: Diagnosis not present

## 2021-09-16 DIAGNOSIS — R55 Syncope and collapse: Secondary | ICD-10-CM | POA: Diagnosis not present

## 2021-10-01 DIAGNOSIS — R0602 Shortness of breath: Secondary | ICD-10-CM | POA: Diagnosis not present

## 2021-10-01 DIAGNOSIS — J449 Chronic obstructive pulmonary disease, unspecified: Secondary | ICD-10-CM | POA: Diagnosis not present

## 2021-10-01 DIAGNOSIS — E871 Hypo-osmolality and hyponatremia: Secondary | ICD-10-CM | POA: Diagnosis not present

## 2021-10-01 DIAGNOSIS — Z20822 Contact with and (suspected) exposure to covid-19: Secondary | ICD-10-CM | POA: Diagnosis not present

## 2021-10-01 DIAGNOSIS — R531 Weakness: Secondary | ICD-10-CM | POA: Diagnosis not present

## 2021-10-01 DIAGNOSIS — Z720 Tobacco use: Secondary | ICD-10-CM | POA: Diagnosis not present

## 2022-03-06 DIAGNOSIS — M25572 Pain in left ankle and joints of left foot: Secondary | ICD-10-CM | POA: Diagnosis not present

## 2022-03-06 DIAGNOSIS — M79672 Pain in left foot: Secondary | ICD-10-CM | POA: Diagnosis not present

## 2022-03-06 DIAGNOSIS — L84 Corns and callosities: Secondary | ICD-10-CM | POA: Diagnosis not present

## 2022-03-06 DIAGNOSIS — M7732 Calcaneal spur, left foot: Secondary | ICD-10-CM | POA: Diagnosis not present

## 2022-03-26 ENCOUNTER — Telehealth: Payer: Self-pay | Admitting: *Deleted

## 2022-03-26 NOTE — Telephone Encounter (Signed)
Patient is calling to give information about his insurance before appointment.

## 2022-03-27 NOTE — Telephone Encounter (Signed)
Called and confirmed with pt that we had the number for his medicaid card.

## 2022-03-28 ENCOUNTER — Ambulatory Visit (INDEPENDENT_AMBULATORY_CARE_PROVIDER_SITE_OTHER): Payer: Medicaid Other

## 2022-03-28 ENCOUNTER — Encounter: Payer: Self-pay | Admitting: Podiatry

## 2022-03-28 ENCOUNTER — Ambulatory Visit (INDEPENDENT_AMBULATORY_CARE_PROVIDER_SITE_OTHER): Payer: Medicaid Other | Admitting: Podiatry

## 2022-03-28 DIAGNOSIS — M722 Plantar fascial fibromatosis: Secondary | ICD-10-CM

## 2022-03-28 DIAGNOSIS — G629 Polyneuropathy, unspecified: Secondary | ICD-10-CM

## 2022-03-28 DIAGNOSIS — M779 Enthesopathy, unspecified: Secondary | ICD-10-CM | POA: Diagnosis not present

## 2022-03-28 DIAGNOSIS — M7752 Other enthesopathy of left foot: Secondary | ICD-10-CM

## 2022-03-28 MED ORDER — TRIAMCINOLONE ACETONIDE 10 MG/ML IJ SUSP
10.0000 mg | Freq: Once | INTRAMUSCULAR | Status: AC
  Administered 2022-03-28: 10 mg

## 2022-04-01 NOTE — Progress Notes (Signed)
Subjective:   Patient ID: Travis Weeks, male   DOB: 56 y.o.   MRN: [id number]   HPI Patient presents stating he has had severe pain and he does see a doctor and has developed a lot of pain in his left heel.  Patient does also smoke a pack of cigarettes per day and is overall struggling type health currently and is not active   Review of Systems  All other systems reviewed and are negative.       Objective:  Physical Exam Vitals and nursing note reviewed.  Constitutional:      Appearance: He is well-developed.  Pulmonary:     Effort: Pulmonary effort is normal.  Musculoskeletal:        General: Normal range of motion.  Skin:    General: Skin is warm.  Neurological:     Mental Status: He is alert.     Neurovascular status intact muscle strength was found to be reduced and range of motion reduced.  He has very sensitive feet and I do think he is getting need to see a chronic pain specialist for this and he is going to do this through his family physician and I did note quite a bit of pain in the left heel only area that I was able to identify that I feel like there is potential for help     Assessment:  Difficult condition with probable chronic pain syndrome with inflammation pain left plantar fascia     Plan:  H&P reviewed condition discussed at great length and at this point he is can to see a pain specialist through his family physician I went ahead today I did do sterile prep injected the fascia at insertion 3 mg Kenalog 5 mg Xylocaine to try to reduce the inflammation advised on supportive shoes reappoint as needed  X-rays indicate moderate osteoporosis no other signs of pathology

## 2023-01-22 DIAGNOSIS — G629 Polyneuropathy, unspecified: Secondary | ICD-10-CM | POA: Diagnosis not present

## 2023-01-22 DIAGNOSIS — E871 Hypo-osmolality and hyponatremia: Secondary | ICD-10-CM | POA: Diagnosis not present

## 2023-01-22 DIAGNOSIS — Z8719 Personal history of other diseases of the digestive system: Secondary | ICD-10-CM | POA: Diagnosis not present

## 2023-01-22 DIAGNOSIS — E785 Hyperlipidemia, unspecified: Secondary | ICD-10-CM | POA: Diagnosis not present

## 2023-01-22 DIAGNOSIS — Z720 Tobacco use: Secondary | ICD-10-CM | POA: Diagnosis not present

## 2023-01-22 DIAGNOSIS — I10 Essential (primary) hypertension: Secondary | ICD-10-CM | POA: Diagnosis not present

## 2023-01-22 DIAGNOSIS — F1029 Alcohol dependence with unspecified alcohol-induced disorder: Secondary | ICD-10-CM | POA: Diagnosis not present

## 2023-02-03 DIAGNOSIS — Z87891 Personal history of nicotine dependence: Secondary | ICD-10-CM | POA: Diagnosis not present

## 2023-02-03 DIAGNOSIS — R42 Dizziness and giddiness: Secondary | ICD-10-CM | POA: Diagnosis not present

## 2023-02-03 DIAGNOSIS — R200 Anesthesia of skin: Secondary | ICD-10-CM | POA: Diagnosis not present

## 2023-02-03 DIAGNOSIS — I25810 Atherosclerosis of coronary artery bypass graft(s) without angina pectoris: Secondary | ICD-10-CM | POA: Diagnosis not present

## 2023-02-03 DIAGNOSIS — I10 Essential (primary) hypertension: Secondary | ICD-10-CM | POA: Diagnosis not present

## 2023-02-10 DIAGNOSIS — G319 Degenerative disease of nervous system, unspecified: Secondary | ICD-10-CM | POA: Diagnosis not present

## 2023-02-10 DIAGNOSIS — R200 Anesthesia of skin: Secondary | ICD-10-CM | POA: Diagnosis not present

## 2023-02-10 DIAGNOSIS — I6782 Cerebral ischemia: Secondary | ICD-10-CM | POA: Diagnosis not present

## 2023-02-10 DIAGNOSIS — R42 Dizziness and giddiness: Secondary | ICD-10-CM | POA: Diagnosis not present

## 2023-02-17 DIAGNOSIS — R200 Anesthesia of skin: Secondary | ICD-10-CM | POA: Diagnosis not present

## 2023-02-17 DIAGNOSIS — I6523 Occlusion and stenosis of bilateral carotid arteries: Secondary | ICD-10-CM | POA: Diagnosis not present

## 2023-02-18 DIAGNOSIS — I25810 Atherosclerosis of coronary artery bypass graft(s) without angina pectoris: Secondary | ICD-10-CM | POA: Diagnosis not present

## 2023-02-18 DIAGNOSIS — N179 Acute kidney failure, unspecified: Secondary | ICD-10-CM | POA: Diagnosis not present

## 2023-02-18 DIAGNOSIS — R112 Nausea with vomiting, unspecified: Secondary | ICD-10-CM | POA: Diagnosis not present

## 2023-02-18 DIAGNOSIS — Z20822 Contact with and (suspected) exposure to covid-19: Secondary | ICD-10-CM | POA: Diagnosis not present

## 2023-02-20 DIAGNOSIS — K219 Gastro-esophageal reflux disease without esophagitis: Secondary | ICD-10-CM | POA: Diagnosis not present

## 2023-02-20 DIAGNOSIS — R1310 Dysphagia, unspecified: Secondary | ICD-10-CM | POA: Diagnosis not present

## 2023-02-21 DIAGNOSIS — R1114 Bilious vomiting: Secondary | ICD-10-CM | POA: Diagnosis not present

## 2023-02-23 ENCOUNTER — Telehealth: Payer: Self-pay

## 2023-02-23 NOTE — Transitions of Care (Post Inpatient/ED Visit) (Signed)
   02/23/2023  Name: Travis Weeks MRN: [id number] DOB: 1966/02/19  Today's TOC FU Call Status: Today's TOC FU Call Status:: Unsuccessful Call (1st Attempt) Unsuccessful Call (1st Attempt) Date: 02/23/23  Attempted to reach the patient regarding the most recent Inpatient/ED visit.  Follow Up Plan: Additional outreach attempts will be made to reach the patient to complete the Transitions of Care (Post Inpatient/ED visit) call.   Alyse Low, RN, BA, [facility], CRRN [facility] Coordinator, Transition of Care Ph # [phone]

## 2023-02-25 ENCOUNTER — Telehealth: Payer: Self-pay

## 2023-02-25 NOTE — Transitions of Care (Post Inpatient/ED Visit) (Signed)
02/25/2023  Name: Demond Shallenberger MRN: [id number] DOB: 07-06-1965  Today's TOC FU Call Status: Today's TOC FU Call Status:: Successful TOC FU Call Completed TOC FU Call Complete Date: 02/25/23 Patient's Name and Date of Birth confirmed.  Transition Care Management Follow-up Telephone Call Date of Discharge: 02/21/23 Discharge Facility: Other [facility]) Name of Other (Non[facility]) Discharge Facility: [facility] Type of Discharge: Inpatient Admission Primary Inpatient Discharge Diagnosis:: Alcoholic gastritis without bleeding How have you been since you were released from the hospital?: Better Any questions or concerns?: Yes Patient Questions/Concerns:: Patient asked about his antibiotic RX, patient was educated to take the antibiotic exactly as directed twice daily and to take all the antibiotics until there are none left even though he states he feels better already. Encouraged pt to make an appointment today with his PCP for post hospitalization follow up - says he sees Dr. Carmine Savoy and will call him for follow up  Items Reviewed: Did you receive and understand the discharge instructions provided?: Yes Medications obtained,verified, and reconciled?: Yes (Medications Reviewed) Any new allergies since your discharge?: No Dietary orders reviewed?: Yes Type of Diet Ordered:: Discussed choosing bland foods that are non-acidic and low in sugar. Discussed cutting down on drinking alcohol and making sure to take his pantoprazole first thing in the morning before food. Do you have support at home?: Yes People in Home: friend(s) Name of Support/Comfort Primary Source: Pt states he has a friend "buddy" who comes over and helps im clean and prepares meals for him. States he has plenty of food and getting adequate dollar amounts added to his EBT card monthly.  Medications Reviewed Today: Medications Reviewed Today     Reviewed by Marcos Eke, RN (Registered Nurse) on  02/25/23 at 1138  Med List Status: <None>   Medication Order Taking? Sig Documenting Provider Last Dose Status Informant  amLODipine (NORVASC) 5 MG tablet [id number] Yes Take 1 tablet (5 mg total) by mouth daily. Catarina Hartshorn, MD Taking Active   folic acid (FOLVITE) 1 MG tablet [id number] Yes Take 1 mg by mouth daily. [provider] Taking Active Self  lisinopril (ZESTRIL) 10 MG tablet [id number] Yes Take 1 tablet by mouth daily. [provider] Taking Active   pantoprazole (PROTONIX) 40 MG tablet [id number] Yes Take 1 tablet (40 mg total) by mouth daily. Catarina Hartshorn, MD Taking Active   sodium chloride 1 g tablet [id number] Yes Take by mouth. [provider] Taking Active   timolol (TIMOPTIC) 0.5 % ophthalmic solution [id number] Yes Place 1 drop into the left eye 2 times daily for 14 days. [provider] Taking Active             Home Care and Equipment/Supplies: Were Home Health Services Ordered?: NA Any new equipment or medical supplies ordered?: NA  Functional Questionnaire: Do you need assistance with bathing/showering or dressing?: No Do you need assistance with meal preparation?: Yes (has a "buddy" who comes over to cook and clean for him) Do you need assistance with eating?: No Do you have difficulty maintaining continence: No Do you need assistance with getting out of bed/getting out of a chair/moving?: No Do you have difficulty managing or taking your medications?: No  Follow up appointments reviewed: PCP Follow-up appointment confirmed?: (S) No (patient does have a PCP - Dr. Carmine Savoy at [facility], [facility] [place] - states he wil call PCP for follow up appointment "for sure" days before he completes the antibiotics he was  given on discharge) MD Provider Line Number:[phone] Given: No Specialist Hospital Follow-up appointment confirmed?: (S) No (Patient stated he is supposed to follow up with a GI doctor but could not find  name on Discharge Instructions - advised patient to call his PCP as soon as possible and ask about Follow up Specialist referral to GI) Do you need transportation to your follow-up appointment?: No (Patient states when the time comes, he will get a ride from someone) Do you understand care options if your condition(s) worsen?: Yes-patient verbalized understanding    Alyse Low, RN, BA, [facility], CRRN [facility] Population Health Care Management Coordinator, Transition of Care Ph # [phone]

## 2023-03-05 DIAGNOSIS — K2920 Alcoholic gastritis without bleeding: Secondary | ICD-10-CM | POA: Diagnosis not present

## 2023-03-05 DIAGNOSIS — Z09 Encounter for follow-up examination after completed treatment for conditions other than malignant neoplasm: Secondary | ICD-10-CM | POA: Diagnosis not present

## 2023-03-05 DIAGNOSIS — Z1211 Encounter for screening for malignant neoplasm of colon: Secondary | ICD-10-CM | POA: Diagnosis not present

## 2023-03-05 DIAGNOSIS — E871 Hypo-osmolality and hyponatremia: Secondary | ICD-10-CM | POA: Diagnosis not present

## 2023-03-05 DIAGNOSIS — F10982 Alcohol use, unspecified with alcohol-induced sleep disorder: Secondary | ICD-10-CM | POA: Diagnosis not present

## 2023-03-05 DIAGNOSIS — G319 Degenerative disease of nervous system, unspecified: Secondary | ICD-10-CM | POA: Diagnosis not present

## 2023-03-10 ENCOUNTER — Encounter: Payer: Self-pay | Admitting: *Deleted

## 2023-04-26 DIAGNOSIS — R079 Chest pain, unspecified: Secondary | ICD-10-CM | POA: Diagnosis not present

## 2023-04-26 DIAGNOSIS — E871 Hypo-osmolality and hyponatremia: Secondary | ICD-10-CM | POA: Diagnosis not present

## 2023-04-26 DIAGNOSIS — R062 Wheezing: Secondary | ICD-10-CM | POA: Diagnosis not present

## 2023-04-26 DIAGNOSIS — Z8659 Personal history of other mental and behavioral disorders: Secondary | ICD-10-CM | POA: Diagnosis not present

## 2023-04-26 DIAGNOSIS — E860 Dehydration: Secondary | ICD-10-CM | POA: Diagnosis not present

## 2023-04-26 DIAGNOSIS — A419 Sepsis, unspecified organism: Secondary | ICD-10-CM | POA: Diagnosis not present

## 2023-04-26 DIAGNOSIS — R197 Diarrhea, unspecified: Secondary | ICD-10-CM | POA: Diagnosis not present

## 2023-04-27 DIAGNOSIS — E871 Hypo-osmolality and hyponatremia: Secondary | ICD-10-CM | POA: Diagnosis not present

## 2023-04-28 DIAGNOSIS — M726 Necrotizing fasciitis: Secondary | ICD-10-CM | POA: Diagnosis not present

## 2023-04-28 DIAGNOSIS — L02215 Cutaneous abscess of perineum: Secondary | ICD-10-CM | POA: Diagnosis not present

## 2023-04-29 DIAGNOSIS — E860 Dehydration: Secondary | ICD-10-CM | POA: Diagnosis not present

## 2023-04-29 DIAGNOSIS — Z8659 Personal history of other mental and behavioral disorders: Secondary | ICD-10-CM | POA: Diagnosis not present

## 2023-04-29 DIAGNOSIS — Z79899 Other long term (current) drug therapy: Secondary | ICD-10-CM | POA: Diagnosis not present

## 2023-04-29 DIAGNOSIS — I10 Essential (primary) hypertension: Secondary | ICD-10-CM | POA: Diagnosis not present

## 2023-04-29 DIAGNOSIS — E871 Hypo-osmolality and hyponatremia: Secondary | ICD-10-CM | POA: Diagnosis not present

## 2023-04-29 DIAGNOSIS — L02215 Cutaneous abscess of perineum: Secondary | ICD-10-CM | POA: Diagnosis not present

## 2023-04-29 DIAGNOSIS — M726 Necrotizing fasciitis: Secondary | ICD-10-CM | POA: Diagnosis not present

## 2023-04-30 DIAGNOSIS — K612 Anorectal abscess: Secondary | ICD-10-CM | POA: Diagnosis not present

## 2023-04-30 DIAGNOSIS — M726 Necrotizing fasciitis: Secondary | ICD-10-CM | POA: Diagnosis not present

## 2023-04-30 DIAGNOSIS — Z79899 Other long term (current) drug therapy: Secondary | ICD-10-CM | POA: Diagnosis not present

## 2023-04-30 DIAGNOSIS — I10 Essential (primary) hypertension: Secondary | ICD-10-CM | POA: Diagnosis not present

## 2023-04-30 DIAGNOSIS — Z8659 Personal history of other mental and behavioral disorders: Secondary | ICD-10-CM | POA: Diagnosis not present

## 2023-04-30 DIAGNOSIS — R197 Diarrhea, unspecified: Secondary | ICD-10-CM | POA: Diagnosis not present

## 2023-04-30 DIAGNOSIS — K610 Anal abscess: Secondary | ICD-10-CM | POA: Diagnosis not present

## 2023-05-01 DIAGNOSIS — M726 Necrotizing fasciitis: Secondary | ICD-10-CM | POA: Diagnosis not present

## 2023-05-01 DIAGNOSIS — R197 Diarrhea, unspecified: Secondary | ICD-10-CM | POA: Diagnosis not present

## 2023-05-01 DIAGNOSIS — Z8659 Personal history of other mental and behavioral disorders: Secondary | ICD-10-CM | POA: Diagnosis not present

## 2023-05-01 DIAGNOSIS — K610 Anal abscess: Secondary | ICD-10-CM | POA: Diagnosis not present

## 2023-05-01 DIAGNOSIS — L02215 Cutaneous abscess of perineum: Secondary | ICD-10-CM | POA: Diagnosis not present

## 2023-05-01 DIAGNOSIS — I10 Essential (primary) hypertension: Secondary | ICD-10-CM | POA: Diagnosis not present

## 2023-05-02 DIAGNOSIS — E871 Hypo-osmolality and hyponatremia: Secondary | ICD-10-CM | POA: Diagnosis not present

## 2023-05-02 DIAGNOSIS — I10 Essential (primary) hypertension: Secondary | ICD-10-CM | POA: Diagnosis not present

## 2023-05-02 DIAGNOSIS — Z811 Family history of alcohol abuse and dependence: Secondary | ICD-10-CM | POA: Diagnosis not present

## 2023-05-02 DIAGNOSIS — M726 Necrotizing fasciitis: Secondary | ICD-10-CM | POA: Diagnosis not present

## 2023-05-02 DIAGNOSIS — R197 Diarrhea, unspecified: Secondary | ICD-10-CM | POA: Diagnosis not present

## 2023-05-03 DIAGNOSIS — L089 Local infection of the skin and subcutaneous tissue, unspecified: Secondary | ICD-10-CM | POA: Diagnosis not present

## 2023-05-03 DIAGNOSIS — E871 Hypo-osmolality and hyponatremia: Secondary | ICD-10-CM | POA: Diagnosis not present

## 2023-05-04 DIAGNOSIS — E871 Hypo-osmolality and hyponatremia: Secondary | ICD-10-CM | POA: Diagnosis not present

## 2023-05-04 DIAGNOSIS — A419 Sepsis, unspecified organism: Secondary | ICD-10-CM | POA: Diagnosis not present

## 2023-05-04 DIAGNOSIS — Z811 Family history of alcohol abuse and dependence: Secondary | ICD-10-CM | POA: Diagnosis not present

## 2023-05-04 DIAGNOSIS — R197 Diarrhea, unspecified: Secondary | ICD-10-CM | POA: Diagnosis not present

## 2023-05-04 DIAGNOSIS — N492 Inflammatory disorders of scrotum: Secondary | ICD-10-CM | POA: Diagnosis not present

## 2023-05-04 DIAGNOSIS — G4700 Insomnia, unspecified: Secondary | ICD-10-CM | POA: Diagnosis not present

## 2023-05-06 DIAGNOSIS — Z8659 Personal history of other mental and behavioral disorders: Secondary | ICD-10-CM | POA: Diagnosis not present

## 2023-05-06 DIAGNOSIS — E878 Other disorders of electrolyte and fluid balance, not elsewhere classified: Secondary | ICD-10-CM | POA: Diagnosis not present

## 2023-05-06 DIAGNOSIS — I10 Essential (primary) hypertension: Secondary | ICD-10-CM | POA: Diagnosis not present

## 2023-05-06 DIAGNOSIS — R197 Diarrhea, unspecified: Secondary | ICD-10-CM | POA: Diagnosis not present

## 2023-05-06 DIAGNOSIS — Z79899 Other long term (current) drug therapy: Secondary | ICD-10-CM | POA: Diagnosis not present

## 2023-05-06 DIAGNOSIS — K6289 Other specified diseases of anus and rectum: Secondary | ICD-10-CM | POA: Diagnosis not present

## 2023-05-06 DIAGNOSIS — J449 Chronic obstructive pulmonary disease, unspecified: Secondary | ICD-10-CM | POA: Diagnosis not present

## 2023-05-07 DIAGNOSIS — A419 Sepsis, unspecified organism: Secondary | ICD-10-CM | POA: Diagnosis not present

## 2023-05-07 DIAGNOSIS — L02215 Cutaneous abscess of perineum: Secondary | ICD-10-CM | POA: Diagnosis not present

## 2023-05-07 DIAGNOSIS — Z79899 Other long term (current) drug therapy: Secondary | ICD-10-CM | POA: Diagnosis not present

## 2023-05-07 DIAGNOSIS — I10 Essential (primary) hypertension: Secondary | ICD-10-CM | POA: Diagnosis not present

## 2023-05-07 DIAGNOSIS — I96 Gangrene, not elsewhere classified: Secondary | ICD-10-CM | POA: Diagnosis not present

## 2023-05-07 DIAGNOSIS — E878 Other disorders of electrolyte and fluid balance, not elsewhere classified: Secondary | ICD-10-CM | POA: Diagnosis not present

## 2023-05-07 DIAGNOSIS — Z8659 Personal history of other mental and behavioral disorders: Secondary | ICD-10-CM | POA: Diagnosis not present

## 2023-05-07 DIAGNOSIS — N492 Inflammatory disorders of scrotum: Secondary | ICD-10-CM | POA: Diagnosis not present

## 2023-05-07 DIAGNOSIS — J449 Chronic obstructive pulmonary disease, unspecified: Secondary | ICD-10-CM | POA: Diagnosis not present

## 2023-05-08 DIAGNOSIS — R1909 Other intra-abdominal and pelvic swelling, mass and lump: Secondary | ICD-10-CM | POA: Diagnosis not present

## 2023-05-08 DIAGNOSIS — L02215 Cutaneous abscess of perineum: Secondary | ICD-10-CM | POA: Diagnosis not present

## 2023-05-08 DIAGNOSIS — N492 Inflammatory disorders of scrotum: Secondary | ICD-10-CM | POA: Diagnosis not present

## 2023-05-08 DIAGNOSIS — L03315 Cellulitis of perineum: Secondary | ICD-10-CM | POA: Diagnosis not present

## 2023-05-09 DIAGNOSIS — I10 Essential (primary) hypertension: Secondary | ICD-10-CM | POA: Diagnosis not present

## 2023-05-09 DIAGNOSIS — L02215 Cutaneous abscess of perineum: Secondary | ICD-10-CM | POA: Diagnosis not present

## 2023-05-09 DIAGNOSIS — Z8659 Personal history of other mental and behavioral disorders: Secondary | ICD-10-CM | POA: Diagnosis not present

## 2023-05-09 DIAGNOSIS — E871 Hypo-osmolality and hyponatremia: Secondary | ICD-10-CM | POA: Diagnosis not present

## 2023-05-09 DIAGNOSIS — Z79899 Other long term (current) drug therapy: Secondary | ICD-10-CM | POA: Diagnosis not present

## 2023-05-09 DIAGNOSIS — Z792 Long term (current) use of antibiotics: Secondary | ICD-10-CM | POA: Diagnosis not present

## 2023-05-09 DIAGNOSIS — J449 Chronic obstructive pulmonary disease, unspecified: Secondary | ICD-10-CM | POA: Diagnosis not present

## 2023-05-09 DIAGNOSIS — A419 Sepsis, unspecified organism: Secondary | ICD-10-CM | POA: Diagnosis not present

## 2023-05-09 DIAGNOSIS — K611 Rectal abscess: Secondary | ICD-10-CM | POA: Diagnosis not present

## 2023-05-10 DIAGNOSIS — Z8659 Personal history of other mental and behavioral disorders: Secondary | ICD-10-CM | POA: Diagnosis not present

## 2023-05-10 DIAGNOSIS — J449 Chronic obstructive pulmonary disease, unspecified: Secondary | ICD-10-CM | POA: Diagnosis not present

## 2023-05-10 DIAGNOSIS — Z79899 Other long term (current) drug therapy: Secondary | ICD-10-CM | POA: Diagnosis not present

## 2023-05-10 DIAGNOSIS — Z792 Long term (current) use of antibiotics: Secondary | ICD-10-CM | POA: Diagnosis not present

## 2023-05-10 DIAGNOSIS — L02215 Cutaneous abscess of perineum: Secondary | ICD-10-CM | POA: Diagnosis not present

## 2023-05-10 DIAGNOSIS — K611 Rectal abscess: Secondary | ICD-10-CM | POA: Diagnosis not present

## 2023-05-10 DIAGNOSIS — I10 Essential (primary) hypertension: Secondary | ICD-10-CM | POA: Diagnosis not present

## 2023-05-10 DIAGNOSIS — A419 Sepsis, unspecified organism: Secondary | ICD-10-CM | POA: Diagnosis not present

## 2023-05-10 DIAGNOSIS — E871 Hypo-osmolality and hyponatremia: Secondary | ICD-10-CM | POA: Diagnosis not present

## 2023-05-11 DIAGNOSIS — E871 Hypo-osmolality and hyponatremia: Secondary | ICD-10-CM | POA: Diagnosis not present

## 2023-05-12 DIAGNOSIS — R197 Diarrhea, unspecified: Secondary | ICD-10-CM | POA: Diagnosis not present

## 2023-05-14 DIAGNOSIS — R197 Diarrhea, unspecified: Secondary | ICD-10-CM | POA: Diagnosis not present

## 2023-05-20 DIAGNOSIS — K529 Noninfective gastroenteritis and colitis, unspecified: Secondary | ICD-10-CM | POA: Diagnosis not present

## 2023-05-20 DIAGNOSIS — J449 Chronic obstructive pulmonary disease, unspecified: Secondary | ICD-10-CM | POA: Diagnosis not present

## 2023-05-20 DIAGNOSIS — E871 Hypo-osmolality and hyponatremia: Secondary | ICD-10-CM | POA: Diagnosis not present

## 2023-05-20 DIAGNOSIS — R197 Diarrhea, unspecified: Secondary | ICD-10-CM | POA: Diagnosis not present

## 2023-05-20 DIAGNOSIS — I10 Essential (primary) hypertension: Secondary | ICD-10-CM | POA: Diagnosis not present

## 2023-05-20 DIAGNOSIS — K611 Rectal abscess: Secondary | ICD-10-CM | POA: Diagnosis not present

## 2023-05-20 DIAGNOSIS — J984 Other disorders of lung: Secondary | ICD-10-CM | POA: Diagnosis not present

## 2023-05-20 DIAGNOSIS — R059 Cough, unspecified: Secondary | ICD-10-CM | POA: Diagnosis not present

## 2023-05-21 DIAGNOSIS — K611 Rectal abscess: Secondary | ICD-10-CM | POA: Diagnosis not present

## 2023-05-21 DIAGNOSIS — L03314 Cellulitis of groin: Secondary | ICD-10-CM | POA: Diagnosis not present

## 2023-05-21 DIAGNOSIS — Z79899 Other long term (current) drug therapy: Secondary | ICD-10-CM | POA: Diagnosis not present

## 2023-05-21 DIAGNOSIS — E871 Hypo-osmolality and hyponatremia: Secondary | ICD-10-CM | POA: Diagnosis not present

## 2023-05-21 DIAGNOSIS — R531 Weakness: Secondary | ICD-10-CM | POA: Diagnosis not present

## 2023-05-21 DIAGNOSIS — I10 Essential (primary) hypertension: Secondary | ICD-10-CM | POA: Diagnosis not present

## 2023-05-21 DIAGNOSIS — J449 Chronic obstructive pulmonary disease, unspecified: Secondary | ICD-10-CM | POA: Diagnosis not present

## 2023-05-21 DIAGNOSIS — L03315 Cellulitis of perineum: Secondary | ICD-10-CM | POA: Diagnosis not present

## 2023-05-22 DIAGNOSIS — E871 Hypo-osmolality and hyponatremia: Secondary | ICD-10-CM | POA: Diagnosis not present

## 2023-05-22 DIAGNOSIS — I10 Essential (primary) hypertension: Secondary | ICD-10-CM | POA: Diagnosis not present

## 2023-05-22 DIAGNOSIS — J449 Chronic obstructive pulmonary disease, unspecified: Secondary | ICD-10-CM | POA: Diagnosis not present

## 2023-05-22 DIAGNOSIS — S31511A Laceration without foreign body of unspecified external genital organs, male, initial encounter: Secondary | ICD-10-CM | POA: Diagnosis not present

## 2023-05-22 DIAGNOSIS — K611 Rectal abscess: Secondary | ICD-10-CM | POA: Diagnosis not present

## 2023-05-22 DIAGNOSIS — Z79899 Other long term (current) drug therapy: Secondary | ICD-10-CM | POA: Diagnosis not present

## 2023-05-23 DIAGNOSIS — I10 Essential (primary) hypertension: Secondary | ICD-10-CM | POA: Diagnosis not present

## 2023-05-23 DIAGNOSIS — Z79899 Other long term (current) drug therapy: Secondary | ICD-10-CM | POA: Diagnosis not present

## 2023-05-23 DIAGNOSIS — K611 Rectal abscess: Secondary | ICD-10-CM | POA: Diagnosis not present

## 2023-05-23 DIAGNOSIS — J449 Chronic obstructive pulmonary disease, unspecified: Secondary | ICD-10-CM | POA: Diagnosis not present

## 2023-05-23 DIAGNOSIS — R000 Tachycardia, unspecified: Secondary | ICD-10-CM | POA: Diagnosis not present

## 2023-05-23 DIAGNOSIS — D649 Anemia, unspecified: Secondary | ICD-10-CM | POA: Diagnosis not present

## 2023-05-23 DIAGNOSIS — E876 Hypokalemia: Secondary | ICD-10-CM | POA: Diagnosis not present

## 2023-05-24 DIAGNOSIS — D649 Anemia, unspecified: Secondary | ICD-10-CM | POA: Diagnosis not present

## 2023-05-24 DIAGNOSIS — K611 Rectal abscess: Secondary | ICD-10-CM | POA: Diagnosis not present

## 2023-05-24 DIAGNOSIS — R000 Tachycardia, unspecified: Secondary | ICD-10-CM | POA: Diagnosis not present

## 2023-05-24 DIAGNOSIS — E876 Hypokalemia: Secondary | ICD-10-CM | POA: Diagnosis not present

## 2023-05-24 DIAGNOSIS — I10 Essential (primary) hypertension: Secondary | ICD-10-CM | POA: Diagnosis not present

## 2023-05-24 DIAGNOSIS — J449 Chronic obstructive pulmonary disease, unspecified: Secondary | ICD-10-CM | POA: Diagnosis not present

## 2023-05-24 DIAGNOSIS — Z79899 Other long term (current) drug therapy: Secondary | ICD-10-CM | POA: Diagnosis not present

## 2023-05-25 DIAGNOSIS — D649 Anemia, unspecified: Secondary | ICD-10-CM | POA: Diagnosis not present

## 2023-05-25 DIAGNOSIS — Z79899 Other long term (current) drug therapy: Secondary | ICD-10-CM | POA: Diagnosis not present

## 2023-05-25 DIAGNOSIS — K611 Rectal abscess: Secondary | ICD-10-CM | POA: Diagnosis not present

## 2023-05-25 DIAGNOSIS — E876 Hypokalemia: Secondary | ICD-10-CM | POA: Diagnosis not present

## 2023-05-25 DIAGNOSIS — J449 Chronic obstructive pulmonary disease, unspecified: Secondary | ICD-10-CM | POA: Diagnosis not present

## 2023-05-25 DIAGNOSIS — I10 Essential (primary) hypertension: Secondary | ICD-10-CM | POA: Diagnosis not present

## 2023-05-25 DIAGNOSIS — R000 Tachycardia, unspecified: Secondary | ICD-10-CM | POA: Diagnosis not present

## 2023-05-26 DIAGNOSIS — D649 Anemia, unspecified: Secondary | ICD-10-CM | POA: Diagnosis not present

## 2023-05-26 DIAGNOSIS — K611 Rectal abscess: Secondary | ICD-10-CM | POA: Diagnosis not present

## 2023-05-26 DIAGNOSIS — J449 Chronic obstructive pulmonary disease, unspecified: Secondary | ICD-10-CM | POA: Diagnosis not present

## 2023-05-26 DIAGNOSIS — R000 Tachycardia, unspecified: Secondary | ICD-10-CM | POA: Diagnosis not present

## 2023-05-26 DIAGNOSIS — Z79899 Other long term (current) drug therapy: Secondary | ICD-10-CM | POA: Diagnosis not present

## 2023-05-26 DIAGNOSIS — I10 Essential (primary) hypertension: Secondary | ICD-10-CM | POA: Diagnosis not present

## 2023-05-26 DIAGNOSIS — E876 Hypokalemia: Secondary | ICD-10-CM | POA: Diagnosis not present

## 2023-05-29 DIAGNOSIS — J9601 Acute respiratory failure with hypoxia: Secondary | ICD-10-CM | POA: Diagnosis not present

## 2023-05-29 DIAGNOSIS — E871 Hypo-osmolality and hyponatremia: Secondary | ICD-10-CM | POA: Diagnosis not present

## 2023-05-29 DIAGNOSIS — E876 Hypokalemia: Secondary | ICD-10-CM | POA: Diagnosis not present

## 2023-05-29 DIAGNOSIS — R918 Other nonspecific abnormal finding of lung field: Secondary | ICD-10-CM | POA: Diagnosis not present

## 2023-05-29 DIAGNOSIS — J449 Chronic obstructive pulmonary disease, unspecified: Secondary | ICD-10-CM | POA: Diagnosis not present

## 2023-05-29 DIAGNOSIS — R0602 Shortness of breath: Secondary | ICD-10-CM | POA: Diagnosis not present

## 2023-05-30 DIAGNOSIS — K611 Rectal abscess: Secondary | ICD-10-CM | POA: Diagnosis not present

## 2023-05-30 DIAGNOSIS — Z8659 Personal history of other mental and behavioral disorders: Secondary | ICD-10-CM | POA: Diagnosis not present

## 2023-05-30 DIAGNOSIS — R609 Edema, unspecified: Secondary | ICD-10-CM | POA: Diagnosis not present

## 2023-05-30 DIAGNOSIS — E871 Hypo-osmolality and hyponatremia: Secondary | ICD-10-CM | POA: Diagnosis not present

## 2023-05-30 DIAGNOSIS — Z79899 Other long term (current) drug therapy: Secondary | ICD-10-CM | POA: Diagnosis not present

## 2023-05-30 DIAGNOSIS — S31501A Unspecified open wound of unspecified external genital organs, male, initial encounter: Secondary | ICD-10-CM | POA: Diagnosis not present

## 2023-05-30 DIAGNOSIS — E876 Hypokalemia: Secondary | ICD-10-CM | POA: Diagnosis not present

## 2023-05-30 DIAGNOSIS — Z792 Long term (current) use of antibiotics: Secondary | ICD-10-CM | POA: Diagnosis not present

## 2023-05-30 DIAGNOSIS — J439 Emphysema, unspecified: Secondary | ICD-10-CM | POA: Diagnosis not present

## 2023-05-30 DIAGNOSIS — J449 Chronic obstructive pulmonary disease, unspecified: Secondary | ICD-10-CM | POA: Diagnosis not present

## 2023-05-30 DIAGNOSIS — I700 Atherosclerosis of aorta: Secondary | ICD-10-CM | POA: Diagnosis not present

## 2023-05-30 DIAGNOSIS — T8130XA Disruption of wound, unspecified, initial encounter: Secondary | ICD-10-CM | POA: Diagnosis not present

## 2023-05-30 DIAGNOSIS — R7989 Other specified abnormal findings of blood chemistry: Secondary | ICD-10-CM | POA: Diagnosis not present

## 2023-05-31 DIAGNOSIS — Z8659 Personal history of other mental and behavioral disorders: Secondary | ICD-10-CM | POA: Diagnosis not present

## 2023-05-31 DIAGNOSIS — E876 Hypokalemia: Secondary | ICD-10-CM | POA: Diagnosis not present

## 2023-05-31 DIAGNOSIS — R7989 Other specified abnormal findings of blood chemistry: Secondary | ICD-10-CM | POA: Diagnosis not present

## 2023-05-31 DIAGNOSIS — K611 Rectal abscess: Secondary | ICD-10-CM | POA: Diagnosis not present

## 2023-05-31 DIAGNOSIS — R770 Abnormality of albumin: Secondary | ICD-10-CM | POA: Diagnosis not present

## 2023-05-31 DIAGNOSIS — J449 Chronic obstructive pulmonary disease, unspecified: Secondary | ICD-10-CM | POA: Diagnosis not present

## 2023-05-31 DIAGNOSIS — Z79899 Other long term (current) drug therapy: Secondary | ICD-10-CM | POA: Diagnosis not present

## 2023-05-31 DIAGNOSIS — E871 Hypo-osmolality and hyponatremia: Secondary | ICD-10-CM | POA: Diagnosis not present

## 2023-06-01 DIAGNOSIS — Z79899 Other long term (current) drug therapy: Secondary | ICD-10-CM | POA: Diagnosis not present

## 2023-06-01 DIAGNOSIS — R7989 Other specified abnormal findings of blood chemistry: Secondary | ICD-10-CM | POA: Diagnosis not present

## 2023-06-01 DIAGNOSIS — E871 Hypo-osmolality and hyponatremia: Secondary | ICD-10-CM | POA: Diagnosis not present

## 2023-06-01 DIAGNOSIS — Z8659 Personal history of other mental and behavioral disorders: Secondary | ICD-10-CM | POA: Diagnosis not present

## 2023-06-01 DIAGNOSIS — E876 Hypokalemia: Secondary | ICD-10-CM | POA: Diagnosis not present

## 2023-06-01 DIAGNOSIS — R770 Abnormality of albumin: Secondary | ICD-10-CM | POA: Diagnosis not present

## 2023-06-01 DIAGNOSIS — K611 Rectal abscess: Secondary | ICD-10-CM | POA: Diagnosis not present

## 2023-06-01 DIAGNOSIS — J449 Chronic obstructive pulmonary disease, unspecified: Secondary | ICD-10-CM | POA: Diagnosis not present

## 2023-06-02 DIAGNOSIS — E871 Hypo-osmolality and hyponatremia: Secondary | ICD-10-CM | POA: Diagnosis not present

## 2023-06-02 DIAGNOSIS — J441 Chronic obstructive pulmonary disease with (acute) exacerbation: Secondary | ICD-10-CM | POA: Diagnosis not present

## 2023-06-02 DIAGNOSIS — Z8659 Personal history of other mental and behavioral disorders: Secondary | ICD-10-CM | POA: Diagnosis not present

## 2023-06-02 DIAGNOSIS — E440 Moderate protein-calorie malnutrition: Secondary | ICD-10-CM | POA: Diagnosis not present

## 2023-06-02 DIAGNOSIS — K611 Rectal abscess: Secondary | ICD-10-CM | POA: Diagnosis not present

## 2023-06-02 DIAGNOSIS — Z79899 Other long term (current) drug therapy: Secondary | ICD-10-CM | POA: Diagnosis not present

## 2023-06-02 DIAGNOSIS — R770 Abnormality of albumin: Secondary | ICD-10-CM | POA: Diagnosis not present

## 2023-06-02 DIAGNOSIS — R7989 Other specified abnormal findings of blood chemistry: Secondary | ICD-10-CM | POA: Diagnosis not present

## 2023-06-02 DIAGNOSIS — E876 Hypokalemia: Secondary | ICD-10-CM | POA: Diagnosis not present

## 2023-06-02 DIAGNOSIS — Z681 Body mass index (BMI) 19 or less, adult: Secondary | ICD-10-CM | POA: Diagnosis not present

## 2023-06-06 DIAGNOSIS — E871 Hypo-osmolality and hyponatremia: Secondary | ICD-10-CM | POA: Diagnosis not present

## 2023-06-08 DIAGNOSIS — Z09 Encounter for follow-up examination after completed treatment for conditions other than malignant neoplasm: Secondary | ICD-10-CM | POA: Diagnosis not present

## 2023-06-08 DIAGNOSIS — F1029 Alcohol dependence with unspecified alcohol-induced disorder: Secondary | ICD-10-CM | POA: Diagnosis not present

## 2023-06-08 DIAGNOSIS — K611 Rectal abscess: Secondary | ICD-10-CM | POA: Diagnosis not present

## 2023-06-08 DIAGNOSIS — E871 Hypo-osmolality and hyponatremia: Secondary | ICD-10-CM | POA: Diagnosis not present

## 2023-06-30 DIAGNOSIS — L02215 Cutaneous abscess of perineum: Secondary | ICD-10-CM | POA: Diagnosis not present

## 2023-06-30 DIAGNOSIS — K611 Rectal abscess: Secondary | ICD-10-CM | POA: Diagnosis not present

## 2023-07-10 DIAGNOSIS — K611 Rectal abscess: Secondary | ICD-10-CM | POA: Diagnosis not present

## 2023-07-16 DIAGNOSIS — F1029 Alcohol dependence with unspecified alcohol-induced disorder: Secondary | ICD-10-CM | POA: Diagnosis not present

## 2023-07-16 DIAGNOSIS — M25471 Effusion, right ankle: Secondary | ICD-10-CM | POA: Diagnosis not present

## 2023-07-16 DIAGNOSIS — M25474 Effusion, right foot: Secondary | ICD-10-CM | POA: Diagnosis not present

## 2023-07-16 DIAGNOSIS — M25475 Effusion, left foot: Secondary | ICD-10-CM | POA: Diagnosis not present

## 2023-07-16 DIAGNOSIS — R531 Weakness: Secondary | ICD-10-CM | POA: Diagnosis not present

## 2023-07-16 DIAGNOSIS — M109 Gout, unspecified: Secondary | ICD-10-CM | POA: Diagnosis not present

## 2023-07-16 DIAGNOSIS — M2550 Pain in unspecified joint: Secondary | ICD-10-CM | POA: Diagnosis not present

## 2023-07-16 DIAGNOSIS — M25472 Effusion, left ankle: Secondary | ICD-10-CM | POA: Diagnosis not present

## 2023-07-28 DIAGNOSIS — M25572 Pain in left ankle and joints of left foot: Secondary | ICD-10-CM | POA: Diagnosis not present

## 2023-07-28 DIAGNOSIS — M25571 Pain in right ankle and joints of right foot: Secondary | ICD-10-CM | POA: Diagnosis not present

## 2023-07-28 DIAGNOSIS — K60321 Anal fistula, complex, initial: Secondary | ICD-10-CM | POA: Diagnosis not present

## 2023-07-28 DIAGNOSIS — M7989 Other specified soft tissue disorders: Secondary | ICD-10-CM | POA: Diagnosis not present

## 2023-07-28 DIAGNOSIS — L02215 Cutaneous abscess of perineum: Secondary | ICD-10-CM | POA: Diagnosis not present

## 2023-08-13 DIAGNOSIS — M2550 Pain in unspecified joint: Secondary | ICD-10-CM | POA: Diagnosis not present

## 2023-08-13 DIAGNOSIS — M25471 Effusion, right ankle: Secondary | ICD-10-CM | POA: Diagnosis not present

## 2023-08-13 DIAGNOSIS — M25472 Effusion, left ankle: Secondary | ICD-10-CM | POA: Diagnosis not present

## 2023-08-13 DIAGNOSIS — M25475 Effusion, left foot: Secondary | ICD-10-CM | POA: Diagnosis not present

## 2023-08-13 DIAGNOSIS — M25551 Pain in right hip: Secondary | ICD-10-CM | POA: Diagnosis not present

## 2023-08-13 DIAGNOSIS — K604 Rectal fistula, unspecified: Secondary | ICD-10-CM | POA: Diagnosis not present

## 2023-08-13 DIAGNOSIS — M25552 Pain in left hip: Secondary | ICD-10-CM | POA: Diagnosis not present

## 2023-08-13 DIAGNOSIS — M25474 Effusion, right foot: Secondary | ICD-10-CM | POA: Diagnosis not present

## 2023-08-13 DIAGNOSIS — F1029 Alcohol dependence with unspecified alcohol-induced disorder: Secondary | ICD-10-CM | POA: Diagnosis not present

## 2023-08-16 DIAGNOSIS — F1029 Alcohol dependence with unspecified alcohol-induced disorder: Secondary | ICD-10-CM | POA: Diagnosis not present

## 2023-08-16 DIAGNOSIS — E440 Moderate protein-calorie malnutrition: Secondary | ICD-10-CM | POA: Diagnosis not present

## 2023-08-16 DIAGNOSIS — R109 Unspecified abdominal pain: Secondary | ICD-10-CM | POA: Diagnosis not present

## 2023-08-16 DIAGNOSIS — K2980 Duodenitis without bleeding: Secondary | ICD-10-CM | POA: Diagnosis not present

## 2023-08-16 DIAGNOSIS — Z79899 Other long term (current) drug therapy: Secondary | ICD-10-CM | POA: Diagnosis not present

## 2023-08-16 DIAGNOSIS — R197 Diarrhea, unspecified: Secondary | ICD-10-CM | POA: Diagnosis not present

## 2023-08-16 DIAGNOSIS — R112 Nausea with vomiting, unspecified: Secondary | ICD-10-CM | POA: Diagnosis not present

## 2023-08-16 DIAGNOSIS — K5730 Diverticulosis of large intestine without perforation or abscess without bleeding: Secondary | ICD-10-CM | POA: Diagnosis not present

## 2023-08-16 DIAGNOSIS — E871 Hypo-osmolality and hyponatremia: Secondary | ICD-10-CM | POA: Diagnosis not present

## 2023-08-17 DIAGNOSIS — E440 Moderate protein-calorie malnutrition: Secondary | ICD-10-CM | POA: Diagnosis not present

## 2023-08-17 DIAGNOSIS — Z791 Long term (current) use of non-steroidal anti-inflammatories (NSAID): Secondary | ICD-10-CM | POA: Diagnosis not present

## 2023-08-17 DIAGNOSIS — R197 Diarrhea, unspecified: Secondary | ICD-10-CM | POA: Diagnosis not present

## 2023-08-17 DIAGNOSIS — Z79899 Other long term (current) drug therapy: Secondary | ICD-10-CM | POA: Diagnosis not present

## 2023-08-17 DIAGNOSIS — E871 Hypo-osmolality and hyponatremia: Secondary | ICD-10-CM | POA: Diagnosis not present

## 2023-08-17 DIAGNOSIS — K2980 Duodenitis without bleeding: Secondary | ICD-10-CM | POA: Diagnosis not present

## 2023-08-17 DIAGNOSIS — F1029 Alcohol dependence with unspecified alcohol-induced disorder: Secondary | ICD-10-CM | POA: Diagnosis not present

## 2023-08-17 DIAGNOSIS — K2920 Alcoholic gastritis without bleeding: Secondary | ICD-10-CM | POA: Diagnosis not present

## 2023-08-18 DIAGNOSIS — E440 Moderate protein-calorie malnutrition: Secondary | ICD-10-CM | POA: Diagnosis not present

## 2023-08-18 DIAGNOSIS — K2920 Alcoholic gastritis without bleeding: Secondary | ICD-10-CM | POA: Diagnosis not present

## 2023-08-18 DIAGNOSIS — F1029 Alcohol dependence with unspecified alcohol-induced disorder: Secondary | ICD-10-CM | POA: Diagnosis not present

## 2023-08-18 DIAGNOSIS — E871 Hypo-osmolality and hyponatremia: Secondary | ICD-10-CM | POA: Diagnosis not present

## 2023-08-18 DIAGNOSIS — K2980 Duodenitis without bleeding: Secondary | ICD-10-CM | POA: Diagnosis not present

## 2023-08-18 DIAGNOSIS — Z79899 Other long term (current) drug therapy: Secondary | ICD-10-CM | POA: Diagnosis not present

## 2023-08-19 DIAGNOSIS — Z79899 Other long term (current) drug therapy: Secondary | ICD-10-CM | POA: Diagnosis not present

## 2023-08-19 DIAGNOSIS — E440 Moderate protein-calorie malnutrition: Secondary | ICD-10-CM | POA: Diagnosis not present

## 2023-08-19 DIAGNOSIS — F1029 Alcohol dependence with unspecified alcohol-induced disorder: Secondary | ICD-10-CM | POA: Diagnosis not present

## 2023-08-19 DIAGNOSIS — S3091XA Unspecified superficial injury of lower back and pelvis, initial encounter: Secondary | ICD-10-CM | POA: Diagnosis not present

## 2023-08-19 DIAGNOSIS — K2980 Duodenitis without bleeding: Secondary | ICD-10-CM | POA: Diagnosis not present

## 2023-08-19 DIAGNOSIS — K2920 Alcoholic gastritis without bleeding: Secondary | ICD-10-CM | POA: Diagnosis not present

## 2023-08-19 DIAGNOSIS — E871 Hypo-osmolality and hyponatremia: Secondary | ICD-10-CM | POA: Diagnosis not present

## 2023-08-19 DIAGNOSIS — Z792 Long term (current) use of antibiotics: Secondary | ICD-10-CM | POA: Diagnosis not present

## 2023-08-19 DIAGNOSIS — X58XXXA Exposure to other specified factors, initial encounter: Secondary | ICD-10-CM | POA: Diagnosis not present

## 2023-08-27 ENCOUNTER — Encounter: Payer: Self-pay | Admitting: *Deleted

## 2023-09-09 DIAGNOSIS — K60313 Anal fistula, simple, recurrent: Secondary | ICD-10-CM | POA: Diagnosis not present

## 2023-09-14 ENCOUNTER — Encounter: Payer: Self-pay | Admitting: Internal Medicine

## 2023-09-21 ENCOUNTER — Telehealth: Payer: Self-pay | Admitting: Gastroenterology

## 2023-09-21 NOTE — Telephone Encounter (Signed)
 Pt phone had no voicemail called to schedule a OV.

## 2023-10-08 ENCOUNTER — Ambulatory Visit: Admitting: Gastroenterology

## 2023-10-23 ENCOUNTER — Encounter: Payer: Self-pay | Admitting: Gastroenterology

## 2023-10-23 NOTE — Progress Notes (Unsigned)
 GI Office Note    Referring Provider: Melvenia Stabs, MD Primary Care Physician:  Hershel Los, DO  Primary Gastroenterologist: Rheba Cedar, MD   Chief Complaint   Chief Complaint  Patient presents with   Colonoscopy    Has issues with reflux, currently takes famotidine  once to twice daily.      History of Present Illness   Travis Weeks is a 58 y.o. male presenting today the request of Dr. Beatris Lincoln for history of anal fistula, need updated colonoscopy to rule out Crohn's.    Patient recently seen for complex anal fistula history by Dr. Camilo Cella. By records, he had significant infection around 04/2023, involving much of the perineum, requiring multiple surgeries by Dr. Louanne Roussel at [facility], he has had protracted recovery due to size of wound but packing stopped back in March.    Labs 08/2023: Sodium 134, potassium 3.3, BUN 3, creatinine 0.58, glucose 93, total bilirubin 0.4, alk phos 112, AST 12, ALT 14, albumin 2.4, hemoglobin 9.3, hematocrit 25.4, white blood cell count 8.3, platelets 233,000.  CT abdomen pelvis with contrast April 2025: 1.    Findings suggesting duodenitis. There is also mild thickening of the distal thoracic esophagus which may reflect esophagitis. Consider follow-up endoscopy.  2.    Colonic diverticulosis without acute diverticulitis.  3.    Severe atherosclerotic disease.  4.    Decreased reticular opacity in the posterior right lower lobe, may reflect scarring or recurrent aspiration.   MRI pelvis with and without contrast March 2025: Transsphincteric sinus tract extending from the posterior midline sphincter complex to the right lateral soft tissues. Correlation for drainage to further indicate a patent fistulous tract is recommended. No well-formed fluid collection to indicate abscess along the tract.   Today:  No longer having to go to wound center. States he almost died from his perineal infections. He is aware that he needs  colonoscopy to rule out Crohn's per Dr. Maurie Southern request.   BMs all over the place. Feels like he has to go but can't go. Or has BM and they are very small. In the past would have BM every day. Now having stool several times per week but does not feel productive. No loose stool. Feels constipated all the time. Abdominal pain/gas/cramping. No melena, brbpr. Heartburn doing ok on famotidine . Sometimes breakthrough with spicy foods. No n/v. Sometimes hard to eat, hard to swallow. Gets tired of chewing.   Uses pepto bismol for discomfort but increases constipation. Has used miralax as needed in the past with good results.   Colonoscopy 01/2017: -diverticulosis  Colonoscopy 01/2017: -erosive reflux esophagitis -small hiatal hernia/gastric erosions s/p bx, reactive gastropathy/chemical gastritis but no h.pylori -duodenal erosions  Medications   Current Outpatient Medications  Medication Sig Dispense Refill   albuterol (VENTOLIN HFA) 108 (90 Base) MCG/ACT inhaler Inhale 2 puffs into the lungs every 6 (six) hours as needed for wheezing or shortness of breath.     amLODipine  (NORVASC ) 5 MG tablet Take 1 tablet (5 mg total) by mouth daily. 30 tablet 1   diclofenac (VOLTAREN) 75 MG EC tablet Take 75 mg by mouth 2 (two) times daily.     famotidine  (PEPCID ) 20 MG tablet Take 20 mg by mouth 2 (two) times daily.     folic acid  (FOLVITE ) 1 MG tablet Take 1 mg by mouth daily.     lisinopril  (ZESTRIL ) 20 MG tablet Take 1 tablet by mouth daily.     magnesium oxide (MAG-OX)  400 MG tablet Take 500 mg by mouth daily.     metoprolol tartrate (LOPRESSOR) 25 MG tablet Take 25 mg by mouth 2 (two) times daily.     QUEtiapine (SEROQUEL) 100 MG tablet Take 100 mg by mouth at bedtime.     sodium chloride  1 g tablet Take by mouth. (Patient not taking: Reported on 10/26/2023)     No current facility-administered medications for this visit.    Allergies   Allergies as of 10/26/2023   (No Known Allergies)    Past  Medical History   Past Medical History:  Diagnosis Date   Anal fistula, complex, unspecified    in setting of significant perineal infection   COPD (chronic obstructive pulmonary disease) (HCC)    ETOH abuse    GERD (gastroesophageal reflux disease)    HTN (hypertension)    Stomach ulcer     Past Surgical History   Past Surgical History:  Procedure Laterality Date   BIOPSY  01/29/2017   Procedure: BIOPSY;  Surgeon: Suzette Espy, MD;  Location: [facility];  Service: Endoscopy;;  gastric   COLONOSCOPY WITH PROPOFOL  N/A 01/29/2017   Rourk: diverticulosis. next colonoscopy in 10 years   ESOPHAGOGASTRODUODENOSCOPY (EGD) WITH PROPOFOL  N/A 01/29/2017   Rourk: erosive reflux esophagitis, gastric erosions (benign bx and no h.pylori), duodenal erosions   FINGER SURGERY Left    middle finger of L hand. pt unable to move it    Past Family History   Family History  Problem Relation Age of Onset   Hypertension Mother    Diabetes Mother    Colon cancer Neg Hx     Past Social History   Social History   Socioeconomic History   Marital status: Single    Spouse name: Not on file   Number of children: Not on file   Years of education: Not on file   Highest education level: Not on file  Occupational History   Occupation: Advice worker  Tobacco Use   Smoking status: Every Day    Current packs/day: 1.00    Average packs/day: 1 pack/day for 49.5 years (49.5 ttl pk-yrs)    Types: Cigarettes    Start date: 1976   Smokeless tobacco: Never  Vaping Use   Vaping status: Never Used  Substance and Sexual Activity   Alcohol use: Yes    Alcohol/week: 2.0 - 3.0 standard drinks of alcohol    Types: 2 - 3 Cans of beer per week    Comment: several days per week (01/06/17). as of 10/26/23, has a beer when watching a game, not all the time   Drug use: Not Currently    Comment: UDS positive for cocaine 08/2016. No drugs in several months (as of 12/2016).   Sexual activity: Not Currently  Other  Topics Concern   Not on file  Social History Narrative   Not on file   Social Drivers of Health   Financial Resource Strain: Low Risk  (05/29/2023)   Received from [facility]   Overall Financial Resource Strain (CARDIA)    Difficulty of Paying Living Expenses: Not hard at all  Food Insecurity: No Food Insecurity (06/08/2023)   Received from [facility]   Hunger Vital Sign    Within the past 12 months, you worried that your food would run out before you got the money to buy more.: Never true    Within the past 12 months, the food you bought just didn't last and you didn't have  money to get more.: Never true  Transportation Needs: No Transportation Needs (06/08/2023)   Received from [facility]   [facility] - Transportation    Lack of Transportation (Medical): No    Lack of Transportation (Non-Medical): No  Physical Activity: Insufficiently Active (02/18/2023)   Received from [facility]   Exercise Vital Sign    On average, how many days per week do you engage in moderate to strenuous exercise (like a brisk walk)?: 2 days    On average, how many minutes do you engage in exercise at this level?: 10 min  Stress: No Stress Concern Present (06/08/2023)   Received from [facility] - Occupational Stress Questionnaire    Feeling of Stress : Not at all  Social Connections: Moderately Isolated (06/08/2023)   Received from [facility]   Social Connection and Isolation Panel    In a typical week, how many times do you talk on the phone with family, friends, or neighbors?: More than three times a week    How often do you get together with friends or relatives?: Never    How often do you attend church or religious services?: Never    Do you belong to any clubs or organizations such as church groups, unions, fraternal or athletic groups, or school groups?: Yes    How often do you attend meetings of the clubs or organizations you belong  to?: Never    Are you married, widowed, divorced, separated, never married, or living with a partner?: Never married  Intimate Partner Violence: Not At Risk (05/29/2023)   Received from [facility]   Humiliation, Afraid, Rape, and Kick questionnaire    Within the last year, have you been afraid of your partner or ex-partner?: No    Within the last year, have you been humiliated or emotionally abused in other ways by your partner or ex-partner?: No    Within the last year, have you been kicked, hit, slapped, or otherwise physically hurt by your partner or ex-partner?: No    Within the last year, have you been raped or forced to have any kind of sexual activity by your partner or ex-partner?: No    Review of Systems   General: Negative for anorexia, weight loss, fever, chills, fatigue, weakness. Eyes: Negative for vision changes.  ENT: Negative for hoarseness,   nasal congestion.see hpi CV: Negative for chest pain, angina, palpitations, dyspnea on exertion, peripheral edema.  Respiratory: Negative for dyspnea at rest, dyspnea on exertion, cough, sputum, wheezing.  GI: See history of present illness. GU:  Negative for dysuria, hematuria, urinary incontinence, urinary frequency, nocturnal urination.  MS: Negative for joint pain, low back pain.  Derm: Negative for rash or itching.  Neuro: Negative for weakness, abnormal sensation, seizure, frequent headaches, memory loss,  confusion.  Psych: Negative for anxiety, depression, suicidal ideation, hallucinations.  Endo: Negative for unusual weight change.  Heme: Negative for bruising or bleeding. Allergy: Negative for rash or hives.  Physical Exam   BP 90/61 (BP Location: Right Arm, Patient Position: Sitting, Cuff Size: Normal)   Pulse 72   Temp 97.6 F (36.4 C) (Oral)   Ht 5' 10 (1.778 m)   Wt 144 lb 6.4 oz (65.5 kg)   BMI 20.72 kg/m    General: Well-nourished, well-developed in no acute distress.  Head: Normocephalic,  atraumatic.   Eyes: Conjunctiva pink, no icterus. Mouth: Oropharyngeal mucosa moist and pink  Neck: Supple  without thyromegaly, masses, or lymphadenopathy.  Lungs: Clear to auscultation bilaterally.  Heart: Regular rate and rhythm, no murmurs rubs or gallops.  Abdomen: Bowel sounds are normal,   nondistended, no hepatosplenomegaly or masses,  no abdominal bruits or hernia, no rebound or guarding.  Epigastric tenderness Rectal: not performed Extremities: No lower extremity edema. No clubbing or deformities.  Neuro: Alert and oriented x 4 , grossly normal neurologically.  Skin: Warm and dry, no rash or jaundice.   Psych: Alert and cooperative, normal mood and affect.  Labs   See hpi  Imaging Studies   No results found.  Assessment/Plan:   GERD/abnormal esophagus on CT/dysphagia: -Overall reflux well controlled with pepcid  except for occasional breakthrough reflux -some vague dysphagia to solid foods -CT with abnormal distal esophagus, egd recommended -EGD/ED. ASA 3, room 1,2.  I have discussed the risks, alternatives, benefits with regards to but not limited to the risk of reaction to medication, bleeding, infection, perforation and the patient is agreeable to proceed. Written consent to be obtained. -continue pepcid  for now  Duodenitis on CT: -evaluate at time of EGD  Epigastric pain: possibly related to duodenitis/gastritis/gerd -CBC, CMET -EGD  Constipation:  -start Miralax one capful BID until soft stool then continue once to twice daily as needed  Anemia: -follow up labs, CBC, iron/tibc, b12, folate  Anal fistula: -colonoscopy requested by surgery to rule out underlying Crohn's disease -ASA 3, Room 1,2 ok.  I have discussed the risks, alternatives, benefits with regards to but not limited to the risk of reaction to medication, bleeding, infection, perforation and the patient is agreeable to proceed. Written consent to be obtained.      Trudie Fuse. Harles Lied, MHS,  PA-C [facility]

## 2023-10-26 ENCOUNTER — Telehealth: Payer: Self-pay | Admitting: *Deleted

## 2023-10-26 ENCOUNTER — Ambulatory Visit (INDEPENDENT_AMBULATORY_CARE_PROVIDER_SITE_OTHER): Admitting: Gastroenterology

## 2023-10-26 ENCOUNTER — Encounter: Payer: Self-pay | Admitting: Gastroenterology

## 2023-10-26 VITALS — BP 90/61 | HR 72 | Temp 97.6°F | Ht 70.0 in | Wt 144.4 lb

## 2023-10-26 DIAGNOSIS — K5900 Constipation, unspecified: Secondary | ICD-10-CM | POA: Insufficient documentation

## 2023-10-26 DIAGNOSIS — D649 Anemia, unspecified: Secondary | ICD-10-CM | POA: Diagnosis not present

## 2023-10-26 DIAGNOSIS — K219 Gastro-esophageal reflux disease without esophagitis: Secondary | ICD-10-CM | POA: Diagnosis not present

## 2023-10-26 DIAGNOSIS — K60329 Anal fistula, complex, unspecified: Secondary | ICD-10-CM | POA: Insufficient documentation

## 2023-10-26 DIAGNOSIS — K5904 Chronic idiopathic constipation: Secondary | ICD-10-CM

## 2023-10-26 DIAGNOSIS — R1013 Epigastric pain: Secondary | ICD-10-CM | POA: Diagnosis not present

## 2023-10-26 DIAGNOSIS — R1310 Dysphagia, unspecified: Secondary | ICD-10-CM | POA: Insufficient documentation

## 2023-10-26 DIAGNOSIS — K603 Anal fistula, unspecified: Secondary | ICD-10-CM

## 2023-10-26 DIAGNOSIS — K2980 Duodenitis without bleeding: Secondary | ICD-10-CM | POA: Diagnosis not present

## 2023-10-26 DIAGNOSIS — R933 Abnormal findings on diagnostic imaging of other parts of digestive tract: Secondary | ICD-10-CM | POA: Insufficient documentation

## 2023-10-26 MED ORDER — POLYETHYLENE GLYCOL 3350 17 GM/SCOOP PO POWD: ORAL | 11 refills | Status: AC

## 2023-10-26 NOTE — Patient Instructions (Signed)
 Please complete labs this week.  We will schedule upper endoscopy and colonoscopy in the near future.   Start miralax one capful twice daily until you have a good soft stool. Then continue once to twice daily to maintain regular stool.

## 2023-10-26 NOTE — Telephone Encounter (Signed)
 Pt is going to call when he can find someone to ride [employer] with him so he can have his procedure done. Aware he can not ride [employer] alone  Needs TCS/EGD/ED, asa 3 (ok rm 1-2)

## 2023-10-28 ENCOUNTER — Telehealth: Payer: Self-pay

## 2023-10-28 NOTE — Telephone Encounter (Signed)
 Patient seen in office

## 2023-10-28 NOTE — Telephone Encounter (Signed)
 Who is your primary care physician: Dr.Glassner  Reasons for the colonoscopy: Screening  Have you had a colonoscopy before?  Yes 01-29-17 Dr.Rourk  Do you have family history of colon cancer? no  Previous colonoscopy with polyps removed? no  Do you have a history colorectal cancer?   no  Are you diabetic? If yes, Type 1 or Type 2?    no  Do you have a prosthetic or mechanical heart valve? no  Do you have a pacemaker/defibrillator?   no  Have you had endocarditis/atrial fibrillation? no  Have you had joint replacement within the last 12 months?  no  Do you tend to be constipated or have to use laxatives? yes  Do you have any history of drugs or alchohol?  no  Do you use supplemental oxygen?  no  Have you had a stroke or heart attack within the last 6 months? no  Do you take weight loss medication?  no      Do you take any blood-thinning medications such as: (aspirin, warfarin, Plavix, Aggrenox)  no  If yes we need the name, milligram, dosage and who is prescribing doctor  Current Outpatient Medications on File Prior to Visit  Medication Sig Dispense Refill   albuterol (VENTOLIN HFA) 108 (90 Base) MCG/ACT inhaler Inhale 2 puffs into the lungs every 6 (six) hours as needed for wheezing or shortness of breath.     amLODipine  (NORVASC ) 5 MG tablet Take 1 tablet (5 mg total) by mouth daily. 30 tablet 1   diclofenac (VOLTAREN) 75 MG EC tablet Take 75 mg by mouth 2 (two) times daily.     famotidine  (PEPCID ) 20 MG tablet Take 20 mg by mouth 2 (two) times daily.     folic acid  (FOLVITE ) 1 MG tablet Take 1 mg by mouth daily.     lisinopril  (ZESTRIL ) 20 MG tablet Take 1 tablet by mouth daily.     magnesium oxide (MAG-OX) 400 MG tablet Take 500 mg by mouth daily.     metoprolol tartrate (LOPRESSOR) 25 MG tablet Take 25 mg by mouth 2 (two) times daily.     polyethylene glycol powder (MIRALAX) 17 GM/SCOOP powder Mix one capful in 6 ounces of water twice daily until soft stool. Then  continue one capful once to twice daily to maintain regular soft stools. 527 g 11   QUEtiapine (SEROQUEL) 100 MG tablet Take 100 mg by mouth at bedtime.     sodium chloride  1 g tablet Take by mouth. (Patient not taking: Reported on 10/26/2023)     No current facility-administered medications on file prior to visit.    No Known Allergies   Pharmacy: [facility]   Primary Insurance Name: [facility] [id number]  Best number where you can be reached: [phone]

## 2023-10-31 DIAGNOSIS — R112 Nausea with vomiting, unspecified: Secondary | ICD-10-CM | POA: Diagnosis not present

## 2023-10-31 DIAGNOSIS — R5383 Other fatigue: Secondary | ICD-10-CM | POA: Diagnosis not present

## 2023-10-31 DIAGNOSIS — K449 Diaphragmatic hernia without obstruction or gangrene: Secondary | ICD-10-CM | POA: Diagnosis not present

## 2023-10-31 DIAGNOSIS — R531 Weakness: Secondary | ICD-10-CM | POA: Diagnosis not present

## 2023-10-31 DIAGNOSIS — K2980 Duodenitis without bleeding: Secondary | ICD-10-CM | POA: Diagnosis not present

## 2023-11-06 DIAGNOSIS — R1310 Dysphagia, unspecified: Secondary | ICD-10-CM | POA: Diagnosis not present

## 2023-11-06 DIAGNOSIS — D649 Anemia, unspecified: Secondary | ICD-10-CM | POA: Diagnosis not present

## 2023-11-06 DIAGNOSIS — K2980 Duodenitis without bleeding: Secondary | ICD-10-CM | POA: Diagnosis not present

## 2023-11-06 DIAGNOSIS — K219 Gastro-esophageal reflux disease without esophagitis: Secondary | ICD-10-CM | POA: Diagnosis not present

## 2023-11-06 DIAGNOSIS — R933 Abnormal findings on diagnostic imaging of other parts of digestive tract: Secondary | ICD-10-CM | POA: Diagnosis not present

## 2023-11-06 DIAGNOSIS — R1013 Epigastric pain: Secondary | ICD-10-CM | POA: Diagnosis not present

## 2023-11-06 DIAGNOSIS — K60329 Anal fistula, complex, unspecified: Secondary | ICD-10-CM | POA: Diagnosis not present

## 2023-11-06 DIAGNOSIS — K5904 Chronic idiopathic constipation: Secondary | ICD-10-CM | POA: Diagnosis not present

## 2023-11-07 LAB — COMPREHENSIVE METABOLIC PANEL WITH GFR
ALT: 14 IU/L (ref 0–44)
AST: 22 IU/L (ref 0–40)
Albumin: 3.6 g/dL — ABNORMAL LOW (ref 3.8–4.9)
Alkaline Phosphatase: 178 IU/L — ABNORMAL HIGH (ref 44–121)
BUN/Creatinine Ratio: 7 — ABNORMAL LOW (ref 9–20)
BUN: 6 mg/dL (ref 6–24)
Bilirubin Total: 0.4 mg/dL (ref 0.0–1.2)
CO2: 17 mmol/L — ABNORMAL LOW (ref 20–29)
Calcium: 9 mg/dL (ref 8.7–10.2)
Chloride: 91 mmol/L — ABNORMAL LOW (ref 96–106)
Creatinine, Ser: 0.88 mg/dL (ref 0.76–1.27)
Globulin, Total: 2.8 g/dL (ref 1.5–4.5)
Glucose: 83 mg/dL (ref 70–99)
Potassium: 5 mmol/L (ref 3.5–5.2)
Sodium: 125 mmol/L — ABNORMAL LOW (ref 134–144)
Total Protein: 6.4 g/dL (ref 6.0–8.5)
eGFR: 99 mL/min/{1.73_m2} (ref >59)

## 2023-11-07 LAB — CBC WITH DIFFERENTIAL/PLATELET
Basophils Absolute: 0.1 10*3/uL (ref 0.0–0.2)
Basos: 1 %
EOS (ABSOLUTE): 0.4 10*3/uL (ref 0.0–0.4)
Eos: 4 %
Hematocrit: 35.2 % — ABNORMAL LOW (ref 37.5–51.0)
Hemoglobin: 11.9 g/dL — ABNORMAL LOW (ref 13.0–17.7)
Immature Grans (Abs): 0 10*3/uL (ref 0.0–0.1)
Immature Granulocytes: 0 %
Lymphocytes Absolute: 2.9 10*3/uL (ref 0.7–3.1)
Lymphs: 28 %
MCH: 33.3 pg — ABNORMAL HIGH (ref 26.6–33.0)
MCHC: 33.8 g/dL (ref 31.5–35.7)
MCV: 99 fL — ABNORMAL HIGH (ref 79–97)
Monocytes Absolute: 0.7 10*3/uL (ref 0.1–0.9)
Monocytes: 7 %
Neutrophils Absolute: 6.5 10*3/uL (ref 1.4–7.0)
Neutrophils: 60 %
Platelets: 298 10*3/uL (ref 150–450)
RBC: 3.57 x10E6/uL — ABNORMAL LOW (ref 4.14–5.80)
RDW: 12.6 % (ref 11.6–15.4)
WBC: 10.6 10*3/uL (ref 3.4–10.8)

## 2023-11-07 LAB — IRON,TIBC AND FERRITIN PANEL
Ferritin: 186 ng/mL (ref 30–400)
Iron Saturation: 48 % (ref 15–55)
Iron: 88 ug/dL (ref 38–169)
Total Iron Binding Capacity: 184 ug/dL — ABNORMAL LOW (ref 250–450)
UIBC: 96 ug/dL — ABNORMAL LOW (ref 111–343)

## 2023-11-07 LAB — B12 AND FOLATE PANEL
Folate: 6.4 ng/mL (ref >3.0)
Vitamin B-12: 999 pg/mL (ref 232–1245)

## 2023-11-11 NOTE — Telephone Encounter (Signed)
 Patient stated he will will wait for reminder to be sent to him and then he will make appointment.  Reminder is in system for August.

## 2023-11-12 ENCOUNTER — Telehealth: Payer: Self-pay

## 2023-11-12 ENCOUNTER — Ambulatory Visit: Payer: Self-pay | Admitting: Gastroenterology

## 2023-11-12 NOTE — Telephone Encounter (Signed)
 See result note.

## 2023-11-12 NOTE — Telephone Encounter (Signed)
 Pt called regarding results to recent lab work that he had done.

## 2023-11-25 DIAGNOSIS — K279 Peptic ulcer, site unspecified, unspecified as acute or chronic, without hemorrhage or perforation: Secondary | ICD-10-CM | POA: Diagnosis not present

## 2023-11-25 DIAGNOSIS — B9681 Helicobacter pylori [H. pylori] as the cause of diseases classified elsewhere: Secondary | ICD-10-CM | POA: Diagnosis not present

## 2023-12-29 ENCOUNTER — Encounter: Payer: Self-pay | Admitting: "Endocrinology

## 2023-12-29 ENCOUNTER — Ambulatory Visit (INDEPENDENT_AMBULATORY_CARE_PROVIDER_SITE_OTHER): Admitting: "Endocrinology

## 2023-12-29 VITALS — BP 94/66 | HR 68 | Ht 70.0 in | Wt 140.8 lb

## 2023-12-29 DIAGNOSIS — E871 Hypo-osmolality and hyponatremia: Secondary | ICD-10-CM | POA: Diagnosis not present

## 2023-12-29 DIAGNOSIS — F1010 Alcohol abuse, uncomplicated: Secondary | ICD-10-CM | POA: Diagnosis not present

## 2023-12-29 DIAGNOSIS — E440 Moderate protein-calorie malnutrition: Secondary | ICD-10-CM | POA: Diagnosis not present

## 2023-12-29 DIAGNOSIS — F17200 Nicotine dependence, unspecified, uncomplicated: Secondary | ICD-10-CM | POA: Diagnosis not present

## 2023-12-29 DIAGNOSIS — Z720 Tobacco use: Secondary | ICD-10-CM | POA: Insufficient documentation

## 2023-12-29 NOTE — Progress Notes (Signed)
 Endocrinology Consult Note                                            12/29/2023, 3:28 PM   Subjective:    Patient ID: Travis Weeks, male    DOB: 1965/09/05, PCP Verdie Johann LABOR, DO   Past Medical History:  Diagnosis Date   Anal fistula, complex, unspecified    in setting of significant perineal infection   COPD (chronic obstructive pulmonary disease) (HCC)    ETOH abuse    GERD (gastroesophageal reflux disease)    HTN (hypertension)    Stomach ulcer    Past Surgical History:  Procedure Laterality Date   BIOPSY  01/29/2017   Procedure: BIOPSY;  Surgeon: Shaaron Lamar HERO, MD;  [facility]: [facility];  Service: Endoscopy;;  gastric   COLONOSCOPY WITH PROPOFOL  N/A 01/29/2017   Rourk: diverticulosis. next colonoscopy in 10 years   ESOPHAGOGASTRODUODENOSCOPY (EGD) WITH PROPOFOL  N/A 01/29/2017   Rourk: erosive reflux esophagitis, gastric erosions (benign bx and no h.pylori), duodenal erosions   FINGER SURGERY Left    middle finger of L hand. pt unable to move it   Social History   Socioeconomic History   Marital status: Single    Spouse name: Not on file   Number of children: Not on file   Years of education: Not on file   Highest education level: Not on file  Occupational History   Occupation: Advice worker  Tobacco Use   Smoking status: Every Day    Current packs/day: 1.00    Average packs/day: 1 pack/day for 49.6 years (49.6 ttl pk-yrs)    Types: Cigarettes    Start date: 1976   Smokeless tobacco: Never  Vaping Use   Vaping status: Never Used  Substance and Sexual Activity   Alcohol use: Yes    Alcohol/week: 2.0 - 3.0 standard drinks of alcohol    Types: 2 - 3 Cans of beer per week    Comment: several days per week (01/06/17). as of 10/26/23, has a beer when watching a game, not all the time   Drug use: Not Currently    Comment: UDS positive for cocaine 08/2016. No drugs in several months (as of 12/2016).   Sexual activity: Not Currently  Other Topics  Concern   Not on file  Social History Narrative   Not on file   Social Drivers of Health   Financial Resource Strain: Low Risk  (05/29/2023)   Received from [facility]   Overall Financial Resource Strain (CARDIA)    Difficulty of Paying Living Expenses: Not hard at all  Food Insecurity: No Food Insecurity (06/08/2023)   Received from [facility]   Hunger Vital Sign    Within the past 12 months, you worried that your food would run out before you got the money to buy more.: Never true    Within the past 12 months, the food you bought just didn't last and you didn't have money to get more.: Never true  Transportation Needs: No Transportation Needs (06/08/2023)   Received from [facility] - Transportation    Lack of Transportation (Medical): No    Lack of Transportation (Non-Medical): No  Physical Activity: Insufficiently Active (02/18/2023)   Received from [facility]   Exercise Vital Sign    On average,  how many days per week do you engage in moderate to strenuous exercise (like a brisk walk)?: 2 days    On average, how many minutes do you engage in exercise at this level?: 10 min  Stress: No Stress Concern Present (06/08/2023)   Received from [facility] - Occupational Stress Questionnaire    Feeling of Stress : Not at all  Social Connections: Moderately Isolated (06/08/2023)   Received from [facility]   Social Connection and Isolation Panel    In a typical week, how many times do you talk on the phone with family, friends, or neighbors?: More than three times a week    How often do you get together with friends or relatives?: Never    How often do you attend church or religious services?: Never    Do you belong to any clubs or organizations such as church groups, unions, fraternal or athletic groups, or school groups?: Yes    How often do you attend meetings of the clubs or organizations you belong to?:  Never    Are you married, widowed, divorced, separated, never married, or living with a partner?: Never married   Family History  Problem Relation Age of Onset   Kidney disease Mother    Hypertension Mother    Diabetes Mother    Heart attack Mother    Colon cancer Neg Hx    Outpatient Encounter Medications as of 12/29/2023  Medication Sig   folic acid  (FOLVITE ) 1 MG tablet Take 1 mg by mouth daily.   albuterol (VENTOLIN HFA) 108 (90 Base) MCG/ACT inhaler Inhale 2 puffs into the lungs every 6 (six) hours as needed for wheezing or shortness of breath.   amLODipine  (NORVASC ) 5 MG tablet Take 1 tablet (5 mg total) by mouth daily.   diclofenac (VOLTAREN) 75 MG EC tablet Take 75 mg by mouth 2 (two) times daily.   famotidine  (PEPCID ) 20 MG tablet Take 20 mg by mouth 2 (two) times daily.   lisinopril  (ZESTRIL ) 20 MG tablet Take 1 tablet by mouth daily.   magnesium oxide (MAG-OX) 400 MG tablet Take 500 mg by mouth daily.   metoprolol tartrate (LOPRESSOR) 25 MG tablet Take 25 mg by mouth 2 (two) times daily.   polyethylene glycol powder (MIRALAX ) 17 GM/SCOOP powder Mix one capful in 6 ounces of water twice daily until soft stool. Then continue one capful once to twice daily to maintain regular soft stools.   QUEtiapine (SEROQUEL) 100 MG tablet Take 100 mg by mouth at bedtime.   sodium chloride  1 g tablet Take 1 g by mouth 2 (two) times daily with a meal.   zinc gluconate 50 MG tablet Take 50 mg by mouth daily.   No facility-administered encounter medications on file as of 12/29/2023.   ALLERGIES: No Known Allergies  VACCINATION STATUS:  There is no immunization history on file for this patient.  HPI Travis Weeks is 58 y.o. male who presents today with a medical history as above. he is being seen in consultation for hyponatremia requested by Verdie Scull A, DO.  History is obtained directly from the patient as well as chart review.  He is a suboptimal historian.  Patient with  significant medical history of alcoholism, GERD, hypertension, COPD. Review of his medical records show hyponatremia at least from 2018.  He also had intermittent hypokalemia although most recently his potassium has been stabilizing. He does not have acute complaints today.  He reports that he was advised to take a gram salt tablets which he has been taking 1-2 times daily. Patient denies history of CHF, not sure if he has cirrhosis.  He does have chronic hypoalbuminemia.  His sodium ranged between 120 and 126 for the last 4 years. Admittedly, he does not eat enough protein, continues to drink alcohol. His other medical problem includes hypertension for which he takes amlodipine  5 mg p.o. daily, lisinopril  20 mg p.o. daily, bronchodilators.  He is also on antidepressant Seroquel and MiraLAX .  He denies nausea/vomiting, diarrhea. He is not on a diuretic.  Review of Systems  Constitutional: +mildly fluctuating body weight ,  no fatigue, no subjective hyperthermia, no subjective hypothermia Eyes: no blurry vision, no xerophthalmia ENT: no sore throat, no nodules palpated in throat, no dysphagia/odynophagia, no hoarseness Cardiovascular: no Chest Pain, no Shortness of Breath, no palpitations, no leg swelling Respiratory: no cough, no shortness of breath Gastrointestinal: no Nausea/Vomiting/Diarhhea Musculoskeletal: no muscle/joint aches Skin: no rashes Neurological: no tremors, no numbness, no tingling, no dizziness Psychiatric: + depression, no anxiety  Objective:       12/29/2023    1:35 PM 10/26/2023    9:44 AM 02/18/2020   12:52 PM  Vitals with BMI  Height 5' 10 5' 10   Weight 140 lbs 13 oz 144 lbs 6 oz   BMI 20.2 20.72   Systolic 94 90 145  Diastolic 66 61 88  Pulse 68 72 84    BP 94/66   Pulse 68   Ht 5' 10 (1.778 m)   Wt 140 lb 12.8 oz (63.9 kg)   BMI 20.20 kg/m   Wt Readings from Last 3 Encounters:  12/29/23 140 lb 12.8 oz (63.9 kg)  10/26/23 144 lb 6.4 oz (65.5 kg)   02/15/20 131 lb 6.3 oz (59.6 kg)    Physical Exam  Constitutional:  Body mass index is 20.2 kg/m.,  not in acute distress, normal state of mind, + disheveled Eyes: PERRLA, EOMI, no exophthalmos ENT: moist mucous membranes, no gross thyromegaly, no gross cervical lymphadenopathy Cardiovascular: normal precordial activity, Regular Rate and Rhythm, no Murmur/Rubs/Gallops Respiratory:  adequate breathing efforts, no gross chest deformity, Clear to auscultation bilaterally Gastrointestinal: abdomen soft, Non -tender, No distension, Bowel Sounds present, no gross organomegaly Musculoskeletal: no gross deformities, strength intact in all four extremities, no peripheral edema Skin: + Dry skin with poor  turgor,  warm, no rashes Neurological: no tremor with outstretched hands, Deep tendon reflexes normal in bilateral lower extremities.  CMP ( most recent) CMP     Component Value Date/Time   NA 125 (L) 11/06/2023 1035   K 5.0 11/06/2023 1035   CL 91 (L) 11/06/2023 1035   CO2 17 (L) 11/06/2023 1035   GLUCOSE 83 11/06/2023 1035   GLUCOSE 86 02/18/2020 0614   BUN 6 11/06/2023 1035   CREATININE 0.88 11/06/2023 1035   CREATININE 0.67 (L) 09/03/2016 1531   CALCIUM 9.0 11/06/2023 1035   PROT 6.4 11/06/2023 1035   ALBUMIN 3.6 (L) 11/06/2023 1035   AST 22 11/06/2023 1035   ALT 14 11/06/2023 1035   ALKPHOS 178 (H) 11/06/2023 1035   BILITOT 0.4 11/06/2023 1035   EGFR 99 11/06/2023 1035   GFRNONAA >60 02/18/2020 0614     Diabetic Labs (most recent): Lab Results  Component Value Date   HGBA1C 4.6 (L) 02/15/2020   HGBA1C 4.2 09/03/2016     Lipid Panel ( most recent) Lipid Panel     Component Value  Date/Time   CHOL 121 09/05/2016 0405   TRIG 59 09/05/2016 0405   HDL 67 09/05/2016 0405   CHOLHDL 1.8 09/05/2016 0405   VLDL 12 09/05/2016 0405   LDLCALC 42 09/05/2016 0405      Lab Results  Component Value Date   TSH 1.795 09/04/2016   TSH 1.76 09/03/2016      Assessment &  Plan:   1. Hypo-osmolality and hyponatremia 2.  Protein calorie malnutrition  3.  Alcohol abuse  - Jourdon Zimmerle  is being seen at a kind request of Verdie, Deaundre A, DO. - I have reviewed his available  records and clinically evaluated the patient. - Based on these reviews, he has chronic hypoosmolar hyponatremia, likely related to [facility] versus SIADH.  Previous labs showed hypoosmolarity, normal urine osmolality, low urine sodium. Since he has been taking 1 g of sodium chloride  1-2 times daily, I emphasized to him that he can take it consistently twice a day with breakfast and supper.  It is unlikely that he has other endocrine dysfunctions, however he will benefit from workup to rule out adrenal insufficiency, hypothyroidism. Patient is clinically euvolemic.  -I have advised patient to cut his free water by half, increase his other liquids including milk, electrolyte drinks, juice for more solute intake.  He is also encouraged to eat more food including meat sandwiches. It is likely that patient has protein calorie malnutrition as evidenced by hypoalbuminemia.    -Patient is at risk of liver disease, cardiomyopathy, CKD.  His next labs will include the following: - Comprehensive metabolic panel with GFR - Osmolality - Osmolality, urine - Uric Acid - Cortisol-am, blood - TSH - T4, free - Sodium, urine, random  He is advised to cut back on his alcohol and smoking. The patient was counseled on the dangers of tobacco use, and was advised to quit.  Reviewed strategies to maximize success, including removing cigarettes and smoking materials from environment.   - he is advised to maintain close follow up with Verdie Johann LABOR, DO for primary care needs.   -Thank you for involving me in the care of this pleasant patient.  Time spent with the patient: 61  minutes spent in  counseling him about hyponatremia, malnutrition and the rest in obtaining information about his symptoms,  reviewing his previous labs/studies (including abstractions from other facilities),  evaluations, and treatments,  and developing a plan to confirm diagnosis and long term treatment based on the latest standards of care/guidelines; and documenting his care.  Ladamien Rammel Orban participated in the discussions, expressed understanding, and voiced agreement with the above plans.  All questions were answered to his satisfaction. he is encouraged to contact clinic should he have any questions or concerns prior to his return visit.  Follow up plan: Return in about 2 weeks (around 01/12/2024) for F/U with Pre-visit Labs.   Ranny Earl, MD [facility] [address] [place], [place] [address] Phone: [phone]  Fax: [phone]     12/29/2023, 3:28 PM  This note was partially dictated with voice recognition software. Similar sounding words can be transcribed inadequately or may not  be corrected upon review.

## 2024-01-08 ENCOUNTER — Telehealth: Payer: Self-pay

## 2024-01-08 DIAGNOSIS — Z7189 Other specified counseling: Secondary | ICD-10-CM

## 2024-01-08 NOTE — Progress Notes (Signed)
 Complex Care Management Note Care Guide Note  01/08/2024 Name: Travis Weeks MRN: [id number] DOB: 22-Oct-1965   Complex Care Management Outreach Attempts: An unsuccessful telephone outreach was attempted today to offer the patient information about available complex care management services.  Follow Up Plan:  Additional outreach attempts will be made to offer the patient complex care management information and services.   Encounter Outcome:  No Answer  Jeoffrey Buffalo , RMA     [place]  [facility], [facility] Guide  Direct Dial: [phone]  Website: [email or web address]

## 2024-01-12 ENCOUNTER — Ambulatory Visit: Admitting: "Endocrinology

## 2024-01-15 DIAGNOSIS — K2980 Duodenitis without bleeding: Secondary | ICD-10-CM | POA: Diagnosis not present

## 2024-01-15 DIAGNOSIS — E871 Hypo-osmolality and hyponatremia: Secondary | ICD-10-CM | POA: Diagnosis not present

## 2024-01-15 DIAGNOSIS — R112 Nausea with vomiting, unspecified: Secondary | ICD-10-CM | POA: Diagnosis not present

## 2024-01-15 DIAGNOSIS — R29898 Other symptoms and signs involving the musculoskeletal system: Secondary | ICD-10-CM | POA: Diagnosis not present

## 2024-01-15 DIAGNOSIS — K3189 Other diseases of stomach and duodenum: Secondary | ICD-10-CM | POA: Diagnosis not present

## 2024-01-15 DIAGNOSIS — K5730 Diverticulosis of large intestine without perforation or abscess without bleeding: Secondary | ICD-10-CM | POA: Diagnosis not present

## 2024-01-15 DIAGNOSIS — I10 Essential (primary) hypertension: Secondary | ICD-10-CM | POA: Diagnosis not present

## 2024-01-15 DIAGNOSIS — B9681 Helicobacter pylori [H. pylori] as the cause of diseases classified elsewhere: Secondary | ICD-10-CM | POA: Diagnosis not present

## 2024-01-15 DIAGNOSIS — R1013 Epigastric pain: Secondary | ICD-10-CM | POA: Diagnosis not present

## 2024-01-18 NOTE — Progress Notes (Signed)
 Complex Care Management Note  Care Guide Note 01/18/2024 Name: Mell Guia MRN: [id number] DOB: [birth year]  Winnie Barsky is a 58 y.o. year old male who sees Verdie Johann LABOR, DO for primary care. I reached out to Terril Rodgers Burgher by phone today to offer complex care management services.  Mr. L[place]o was given information about Complex Care Management services today including:   The Complex Care Management services include support from the care team which includes your Nurse Care Manager, Clinical Social Worker, or Pharmacist.  The Complex Care Management team is here to help remove barriers to the health concerns and goals most important to you. Complex Care Management services are voluntary, and the patient may decline or stop services at any time by request to their care team member.   Complex Care Management Consent Status: Patient did not agree to participate in complex care management services at this time.  Follow up plan:    Encounter Outcome:  Patient Refused  Jeoffrey Buffalo , RMA     [facility]  [facility], [facility] Guide  Direct Dial: [phone]  Website: [email or web address]

## 2024-01-22 DIAGNOSIS — E871 Hypo-osmolality and hyponatremia: Secondary | ICD-10-CM | POA: Diagnosis not present

## 2024-01-26 ENCOUNTER — Other Ambulatory Visit: Payer: Self-pay | Admitting: "Endocrinology

## 2024-01-26 LAB — COMPREHENSIVE METABOLIC PANEL WITH GFR
ALT: 13 IU/L (ref 0–44)
AST: 25 IU/L (ref 0–40)
Albumin: 3.7 g/dL — ABNORMAL LOW (ref 3.8–4.9)
Alkaline Phosphatase: 118 IU/L (ref 44–121)
BUN/Creatinine Ratio: 10 (ref 9–20)
BUN: 9 mg/dL (ref 6–24)
Bilirubin Total: 0.5 mg/dL (ref 0.0–1.2)
CO2: 20 mmol/L (ref 20–29)
Calcium: 9 mg/dL (ref 8.7–10.2)
Chloride: 88 mmol/L — ABNORMAL LOW (ref 96–106)
Creatinine, Ser: 0.9 mg/dL (ref 0.76–1.27)
Globulin, Total: 2.8 g/dL (ref 1.5–4.5)
Glucose: 83 mg/dL (ref 70–99)
Potassium: 5.4 mmol/L — ABNORMAL HIGH (ref 3.5–5.2)
Sodium: 121 mmol/L — ABNORMAL LOW (ref 134–144)
Total Protein: 6.5 g/dL (ref 6.0–8.5)
eGFR: 98 mL/min/1.73 (ref >59)

## 2024-01-26 LAB — URIC ACID: Uric Acid: 6.8 mg/dL (ref 3.8–8.4)

## 2024-01-26 LAB — SODIUM, URINE, RANDOM: Sodium, Ur: 29 mmol/L

## 2024-01-26 LAB — T4, FREE: Free T4: 0.97 ng/dL (ref 0.82–1.77)

## 2024-01-26 LAB — TSH: TSH: 7.23 u[IU]/mL — ABNORMAL HIGH (ref 0.450–4.500)

## 2024-01-26 LAB — CORTISOL-AM, BLOOD: Cortisol - AM: 12.3 ug/dL (ref 6.2–19.4)

## 2024-01-26 LAB — OSMOLALITY, URINE: Osmolality, Ur: 179 mosm/kg

## 2024-01-26 LAB — OSMOLALITY: Osmolality Meas: 252 mosm/kg — ABNORMAL LOW (ref 275–295)

## 2024-01-26 MED ORDER — LEVOTHYROXINE SODIUM 25 MCG PO TABS: 25.0000 ug | ORAL_TABLET | Freq: Every day | ORAL | 1 refills | Status: AC

## 2024-01-26 MED ORDER — SODIUM CHLORIDE 1 G PO TABS: 2.0000 g | ORAL_TABLET | Freq: Three times a day (TID) | ORAL | 3 refills | Status: DC

## 2024-01-27 ENCOUNTER — Ambulatory Visit: Payer: Self-pay

## 2024-01-27 NOTE — Telephone Encounter (Signed)
Tried to call pt, left a message requesting pt return call to the office.

## 2024-01-27 NOTE — Telephone Encounter (Signed)
-----   Message from [place] Nida sent at 01/26/2024  5:00 PM EDT ----- Zada, Would you advise this patient to double his salt intake  even until he returns for a follow up and also I am sending a rx for levothyroxine  to his pharm. If he is feeling dizzy or change in his  consciousness , he has to go to ER because his sodium is still low. ----- Message ----- From: Interface, [facility] Lab Results In Sent: 01/23/2024   7:38 AM EDT To: Ethelle LELON Earl, MD

## 2024-01-28 ENCOUNTER — Emergency Department (HOSPITAL_COMMUNITY)

## 2024-01-28 ENCOUNTER — Inpatient Hospital Stay (HOSPITAL_COMMUNITY)
Admission: EM | Admit: 2024-01-28 | Discharge: 2024-01-31 | DRG: 641 | Disposition: A | Discharge status: Home / Self Care | Source: Ambulatory Visit | Attending: Internal Medicine | Admitting: Internal Medicine

## 2024-01-28 ENCOUNTER — Encounter (HOSPITAL_COMMUNITY): Payer: Self-pay

## 2024-01-28 ENCOUNTER — Other Ambulatory Visit: Payer: Self-pay

## 2024-01-28 ENCOUNTER — Ambulatory Visit: Admitting: "Endocrinology

## 2024-01-28 ENCOUNTER — Encounter: Payer: Self-pay | Admitting: "Endocrinology

## 2024-01-28 VITALS — BP 90/54 | HR 56 | Ht 70.0 in | Wt 141.8 lb

## 2024-01-28 DIAGNOSIS — I959 Hypotension, unspecified: Secondary | ICD-10-CM | POA: Diagnosis present

## 2024-01-28 DIAGNOSIS — I77819 Aortic ectasia, unspecified site: Secondary | ICD-10-CM | POA: Diagnosis not present

## 2024-01-28 DIAGNOSIS — E869 Volume depletion, unspecified: Secondary | ICD-10-CM | POA: Diagnosis present

## 2024-01-28 DIAGNOSIS — E861 Hypovolemia: Secondary | ICD-10-CM | POA: Diagnosis not present

## 2024-01-28 DIAGNOSIS — Z720 Tobacco use: Secondary | ICD-10-CM | POA: Diagnosis not present

## 2024-01-28 DIAGNOSIS — F17200 Nicotine dependence, unspecified, uncomplicated: Secondary | ICD-10-CM | POA: Diagnosis present

## 2024-01-28 DIAGNOSIS — E871 Hypo-osmolality and hyponatremia: Principal | ICD-10-CM | POA: Diagnosis present

## 2024-01-28 DIAGNOSIS — R918 Other nonspecific abnormal finding of lung field: Secondary | ICD-10-CM | POA: Diagnosis not present

## 2024-01-28 DIAGNOSIS — I10 Essential (primary) hypertension: Secondary | ICD-10-CM | POA: Diagnosis present

## 2024-01-28 DIAGNOSIS — Y901 Blood alcohol level of 20-39 mg/100 ml: Secondary | ICD-10-CM | POA: Diagnosis present

## 2024-01-28 DIAGNOSIS — E440 Moderate protein-calorie malnutrition: Secondary | ICD-10-CM | POA: Diagnosis present

## 2024-01-28 DIAGNOSIS — F1010 Alcohol abuse, uncomplicated: Secondary | ICD-10-CM

## 2024-01-28 DIAGNOSIS — I440 Atrioventricular block, first degree: Secondary | ICD-10-CM | POA: Diagnosis present

## 2024-01-28 DIAGNOSIS — E039 Hypothyroidism, unspecified: Secondary | ICD-10-CM | POA: Diagnosis present

## 2024-01-28 DIAGNOSIS — Z79890 Hormone replacement therapy (postmenopausal): Secondary | ICD-10-CM | POA: Diagnosis not present

## 2024-01-28 DIAGNOSIS — J449 Chronic obstructive pulmonary disease, unspecified: Secondary | ICD-10-CM | POA: Diagnosis present

## 2024-01-28 DIAGNOSIS — Z833 Family history of diabetes mellitus: Secondary | ICD-10-CM

## 2024-01-28 DIAGNOSIS — Z681 Body mass index (BMI) 19 or less, adult: Secondary | ICD-10-CM | POA: Diagnosis not present

## 2024-01-28 DIAGNOSIS — Z79899 Other long term (current) drug therapy: Secondary | ICD-10-CM

## 2024-01-28 DIAGNOSIS — K219 Gastro-esophageal reflux disease without esophagitis: Secondary | ICD-10-CM | POA: Diagnosis present

## 2024-01-28 DIAGNOSIS — F17210 Nicotine dependence, cigarettes, uncomplicated: Secondary | ICD-10-CM | POA: Diagnosis present

## 2024-01-28 DIAGNOSIS — Z8249 Family history of ischemic heart disease and other diseases of the circulatory system: Secondary | ICD-10-CM

## 2024-01-28 DIAGNOSIS — F1020 Alcohol dependence, uncomplicated: Secondary | ICD-10-CM | POA: Diagnosis present

## 2024-01-28 DIAGNOSIS — R531 Weakness: Secondary | ICD-10-CM | POA: Diagnosis not present

## 2024-01-28 DIAGNOSIS — S2242XA Multiple fractures of ribs, left side, initial encounter for closed fracture: Secondary | ICD-10-CM | POA: Diagnosis not present

## 2024-01-28 DIAGNOSIS — R001 Bradycardia, unspecified: Secondary | ICD-10-CM | POA: Diagnosis not present

## 2024-01-28 DIAGNOSIS — Z841 Family history of disorders of kidney and ureter: Secondary | ICD-10-CM | POA: Diagnosis not present

## 2024-01-28 DIAGNOSIS — E875 Hyperkalemia: Secondary | ICD-10-CM | POA: Insufficient documentation

## 2024-01-28 LAB — SODIUM
Sodium: 121 mmol/L — ABNORMAL LOW (ref 135–145)
Sodium: 122 mmol/L — ABNORMAL LOW (ref 135–145)

## 2024-01-28 LAB — COMPREHENSIVE METABOLIC PANEL WITH GFR
ALT: 14 U/L (ref 0–44)
AST: 30 U/L (ref 15–41)
Albumin: 3.3 g/dL — ABNORMAL LOW (ref 3.5–5.0)
Alkaline Phosphatase: 92 U/L (ref 38–126)
Anion gap: 12 (ref 5–15)
BUN: 8 mg/dL (ref 6–20)
CO2: 19 mmol/L — ABNORMAL LOW (ref 22–32)
Calcium: 8.7 mg/dL — ABNORMAL LOW (ref 8.9–10.3)
Chloride: 86 mmol/L — ABNORMAL LOW (ref 98–111)
Creatinine, Ser: 0.9 mg/dL (ref 0.61–1.24)
GFR, Estimated: >60 mL/min (ref >60)
Glucose, Bld: 72 mg/dL (ref 70–99)
Potassium: 4.9 mmol/L (ref 3.5–5.1)
Sodium: 117 mmol/L — CL (ref 135–145)
Total Bilirubin: 0.6 mg/dL (ref 0.0–1.2)
Total Protein: 6.8 g/dL (ref 6.5–8.1)

## 2024-01-28 LAB — LACTIC ACID, PLASMA
Lactic Acid, Venous: 1.6 mmol/L (ref 0.5–1.9)
Lactic Acid, Venous: 0.8 mmol/L (ref 0.5–1.9)

## 2024-01-28 LAB — CBC WITH DIFFERENTIAL/PLATELET
Abs Immature Granulocytes: 0.03 K/uL (ref 0.00–0.07)
Basophils Absolute: 0.1 K/uL (ref 0.0–0.1)
Basophils Relative: 1 %
Eosinophils Absolute: 0.4 K/uL (ref 0.0–0.5)
Eosinophils Relative: 5 %
HCT: 32.8 % — ABNORMAL LOW (ref 39.0–52.0)
Hemoglobin: 11.7 g/dL — ABNORMAL LOW (ref 13.0–17.0)
Immature Granulocytes: 0 %
Lymphocytes Relative: 35 %
Lymphs Abs: 3.3 K/uL (ref 0.7–4.0)
MCH: 33.1 pg (ref 26.0–34.0)
MCHC: 35.7 g/dL (ref 30.0–36.0)
MCV: 92.9 fL (ref 80.0–100.0)
Monocytes Absolute: 0.6 K/uL (ref 0.1–1.0)
Monocytes Relative: 6 %
Neutro Abs: 5.1 K/uL (ref 1.7–7.7)
Neutrophils Relative %: 53 %
Platelets: 261 K/uL (ref 150–400)
RBC: 3.53 MIL/uL — ABNORMAL LOW (ref 4.22–5.81)
RDW: 13.8 % (ref 11.5–15.5)
WBC: 9.4 K/uL (ref 4.0–10.5)
nRBC: 0 % (ref 0.0–0.2)

## 2024-01-28 LAB — URINALYSIS, ROUTINE W REFLEX MICROSCOPIC
Bilirubin Urine: NEGATIVE
Glucose, UA: NEGATIVE mg/dL
Hgb urine dipstick: NEGATIVE
Ketones, ur: NEGATIVE mg/dL
Leukocytes,Ua: NEGATIVE
Nitrite: NEGATIVE
Protein, ur: NEGATIVE mg/dL
Specific Gravity, Urine: 1.002 — ABNORMAL LOW (ref 1.005–1.030)
pH: 5 (ref 5.0–8.0)

## 2024-01-28 LAB — FOLATE: Folate: 6.4 ng/mL (ref >5.9)

## 2024-01-28 LAB — OSMOLALITY: Osmolality: 251 mosm/kg — ABNORMAL LOW (ref 275–295)

## 2024-01-28 LAB — SODIUM, URINE, RANDOM: Sodium, Ur: <30 mmol/L

## 2024-01-28 LAB — MAGNESIUM: Magnesium: 1.9 mg/dL (ref 1.7–2.4)

## 2024-01-28 LAB — RAPID URINE DRUG SCREEN, HOSP PERFORMED
Amphetamines: NOT DETECTED
Barbiturates: NOT DETECTED
Benzodiazepines: NOT DETECTED
Cocaine: NOT DETECTED
Opiates: NOT DETECTED
Tetrahydrocannabinol: NOT DETECTED

## 2024-01-28 LAB — CORTISOL: Cortisol, Plasma: 6.1 ug/dL

## 2024-01-28 LAB — VITAMIN B12: Vitamin B-12: 831 pg/mL (ref 180–914)

## 2024-01-28 LAB — ETHANOL: Alcohol, Ethyl (B): 28 mg/dL — ABNORMAL HIGH (ref <15)

## 2024-01-28 LAB — CREATININE, URINE, RANDOM: Creatinine, Urine: 11 mg/dL

## 2024-01-28 MED ORDER — SODIUM CHLORIDE 0.9 % IV BOLUS
1000.0000 mL | Freq: Once | INTRAVENOUS | Status: AC
  Administered 2024-01-28: 1000 mL via INTRAVENOUS

## 2024-01-28 MED ORDER — ADULT MULTIVITAMIN W/MINERALS CH
1.0000 | ORAL_TABLET | Freq: Every day | ORAL | Status: DC
  Administered 2024-01-28 – 2024-01-31 (×4): 1 via ORAL
  Filled 2024-01-28 (×4): qty 1

## 2024-01-28 MED ORDER — LORAZEPAM 2 MG/ML IJ SOLN: 1.0000 mg | INTRAMUSCULAR | Status: DC | PRN

## 2024-01-28 MED ORDER — ONDANSETRON HCL 4 MG PO TABS: 4.0000 mg | ORAL_TABLET | Freq: Four times a day (QID) | ORAL | Status: DC | PRN

## 2024-01-28 MED ORDER — SODIUM CHLORIDE 1 G PO TABS
2.0000 g | ORAL_TABLET | Freq: Two times a day (BID) | ORAL | Status: DC
  Administered 2024-01-29 – 2024-01-31 (×6): 2 g via ORAL
  Filled 2024-01-28 (×7): qty 2

## 2024-01-28 MED ORDER — THIAMINE HCL 100 MG/ML IJ SOLN: 100.0000 mg | Freq: Every day | INTRAMUSCULAR | Status: DC

## 2024-01-28 MED ORDER — SODIUM CHLORIDE 0.9 % IV SOLN: INTRAVENOUS | Status: AC

## 2024-01-28 MED ORDER — ONDANSETRON HCL 4 MG/2ML IJ SOLN: 4.0000 mg | Freq: Four times a day (QID) | INTRAMUSCULAR | Status: DC | PRN

## 2024-01-28 MED ORDER — FOLIC ACID 1 MG PO TABS
1.0000 mg | ORAL_TABLET | Freq: Every day | ORAL | Status: DC
Start: 2024-01-28 — End: 2024-01-28

## 2024-01-28 MED ORDER — SODIUM CHLORIDE 1 G PO TABS: 2.0000 g | ORAL_TABLET | Freq: Two times a day (BID) | ORAL | 1 refills | Status: DC

## 2024-01-28 MED ORDER — MAGNESIUM OXIDE -MG SUPPLEMENT 400 (240 MG) MG PO TABS
400.0000 mg | ORAL_TABLET | Freq: Every day | ORAL | Status: DC
Start: 2024-01-29 — End: 2024-01-30
  Administered 2024-01-29 – 2024-01-30 (×2): 400 mg via ORAL
  Filled 2024-01-28 (×3): qty 1

## 2024-01-28 MED ORDER — THIAMINE MONONITRATE 100 MG PO TABS
100.0000 mg | ORAL_TABLET | Freq: Every day | ORAL | Status: DC
  Administered 2024-01-28 – 2024-01-31 (×4): 100 mg via ORAL
  Filled 2024-01-28 (×4): qty 1

## 2024-01-28 MED ORDER — ENOXAPARIN SODIUM 40 MG/0.4ML IJ SOSY
40.0000 mg | PREFILLED_SYRINGE | INTRAMUSCULAR | Status: DC
  Administered 2024-01-28 – 2024-01-30 (×3): 40 mg via SUBCUTANEOUS
  Filled 2024-01-28 (×3): qty 0.4

## 2024-01-28 MED ORDER — QUETIAPINE FUMARATE 100 MG PO TABS
100.0000 mg | ORAL_TABLET | Freq: Every day | ORAL | Status: DC
  Administered 2024-01-28 – 2024-01-30 (×3): 100 mg via ORAL
  Filled 2024-01-28 (×3): qty 1

## 2024-01-28 MED ORDER — ACETAMINOPHEN 650 MG RE SUPP: 650.0000 mg | Freq: Four times a day (QID) | RECTAL | Status: DC | PRN

## 2024-01-28 MED ORDER — LEVOTHYROXINE SODIUM 25 MCG PO TABS
25.0000 ug | ORAL_TABLET | Freq: Every day | ORAL | Status: DC
Start: 2024-01-29 — End: 2024-02-01
  Administered 2024-01-29 – 2024-01-31 (×3): 25 ug via ORAL
  Filled 2024-01-28 (×3): qty 1

## 2024-01-28 MED ORDER — FOLIC ACID 1 MG PO TABS
1.0000 mg | ORAL_TABLET | Freq: Every day | ORAL | Status: DC
Start: 2024-01-28 — End: 2024-02-01
  Administered 2024-01-28 – 2024-01-31 (×4): 1 mg via ORAL
  Filled 2024-01-28 (×4): qty 1

## 2024-01-28 MED ORDER — PANTOPRAZOLE SODIUM 40 MG PO TBEC
40.0000 mg | DELAYED_RELEASE_TABLET | Freq: Two times a day (BID) | ORAL | Status: DC
Start: 2024-01-28 — End: 2024-02-01
  Administered 2024-01-28 – 2024-01-31 (×6): 40 mg via ORAL
  Filled 2024-01-28 (×6): qty 1

## 2024-01-28 MED ORDER — LORAZEPAM 1 MG PO TABS: 1.0000 mg | ORAL_TABLET | ORAL | Status: DC | PRN

## 2024-01-28 MED ORDER — ACETAMINOPHEN 325 MG PO TABS: 650.0000 mg | ORAL_TABLET | Freq: Four times a day (QID) | ORAL | Status: DC | PRN

## 2024-01-28 NOTE — ED Provider Notes (Signed)
 [place] EMERGENCY DEPARTMENT AT [facility] Provider Note   CSN: [id number] Arrival date & time: 01/28/24  9043     Patient presents with: hyponatremia   Travis Weeks is a 58 y.o. male.   HPI     Travis Weeks is a 58 y.o. male with past medical history of alcohol use, protein-calorie malnutrition, COPD, hypothyroidism, hyperkalemia and chronic hyponatremia who presents to the Emergency Department under the advisement of his endocrinologist for further evaluation of worsening of his chronic hyponatremia.  He had blood work 6 days ago that showed a sodium level of 121 he was also hyperkalemic at that time.  He was seen by his endocrinologist earlier today and found to be hypotensive in the office and sent here for further evaluation, fluids and possible admission.  Patient endorses generalized weakness, but denies any pain.  He does admit to drinking beer 3-4 times per week.  He also denies denies any recent weight loss, fever chills, chest pain, shortness of breath or abdominal pain.  No nausea vomiting or black or bloody stools.  Prior to Admission medications   Medication Sig Start Date End Date Taking? Authorizing Provider  albuterol (VENTOLIN HFA) 108 (90 Base) MCG/ACT inhaler Inhale 2 puffs into the lungs every 6 (six) hours as needed for wheezing or shortness of breath.    [provider]  amLODipine  (NORVASC ) 5 MG tablet Take 1 tablet (5 mg total) by mouth daily. 02/18/20   Evonnie Lenis, MD  diclofenac (VOLTAREN) 75 MG EC tablet Take 75 mg by mouth 2 (two) times daily. 09/24/23   [provider]  famotidine  (PEPCID ) 20 MG tablet Take 20 mg by mouth 2 (two) times daily.    [provider]  folic acid  (FOLVITE ) 1 MG tablet Take 1 mg by mouth daily.    [provider]  levothyroxine  (SYNTHROID ) 25 MCG tablet Take 1 tablet (25 mcg total) by mouth daily before breakfast. 01/26/24   Nida, Ethelle ORN, MD  lisinopril  (ZESTRIL ) 20  MG tablet Take 1 tablet by mouth daily. 08/11/23   [provider]  magnesium  oxide (MAG-OX) 400 MG tablet Take 500 mg by mouth daily.    [provider]  metoprolol tartrate (LOPRESSOR) 25 MG tablet Take 25 mg by mouth 2 (two) times daily.    [provider]  polyethylene glycol powder (MIRALAX ) 17 GM/SCOOP powder Mix one capful in 6 ounces of water twice daily until soft stool. Then continue one capful once to twice daily to maintain regular soft stools. 10/26/23   Ezzard Sonny RAMAN, PA-C  QUEtiapine  (SEROQUEL ) 100 MG tablet Take 100 mg by mouth at bedtime.    [provider]  sodium chloride  1 g tablet Take 2 tablets (2 g total) by mouth 3 (three) times daily with meals. 01/26/24   Nida, Gebreselassie W, MD  zinc gluconate 50 MG tablet Take 50 mg by mouth daily.    [provider]    Allergies: Patient has no known allergies.    Review of Systems  Constitutional:  Negative for chills and fever.  Respiratory:  Negative for shortness of breath.   Cardiovascular:  Negative for chest pain.  Gastrointestinal:  Negative for abdominal pain, diarrhea, nausea and vomiting.  Neurological:  Positive for weakness. Negative for dizziness and headaches.    Updated Vital Signs BP 119/84   Pulse 62   Temp (!) 97.5 F (36.4 C) (Temporal)   Resp 16   Ht 5' 10 (1.778  m)   Wt 64.3 kg   SpO2 100%   BMI 20.35 kg/m   Physical Exam Vitals and nursing note reviewed.  Constitutional:      General: He is not in acute distress. HENT:     Mouth/Throat:     Mouth: Mucous membranes are dry.  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     Pulses: Normal pulses.  Pulmonary:     Effort: Pulmonary effort is normal.     Breath sounds: Normal breath sounds.  Chest:     Chest wall: No tenderness.  Abdominal:     General: There is no distension.     Palpations: Abdomen is  soft. There is no mass.     Tenderness: There is no abdominal tenderness.  Musculoskeletal:        General: Normal range of motion.  Skin:    General: Skin is warm.     Capillary Refill: Capillary refill takes less than 2 seconds.  Neurological:     General: No focal deficit present.     Mental Status: He is alert and oriented to person, place, and time.     Sensory: Sensation is intact. No sensory deficit.     Motor: Motor function is intact. No weakness.     Coordination: Coordination is intact.     Comments: Patient mentating well.  CN II through XII grossly intact.     (all labs ordered are listed, but only abnormal results are displayed) Labs Reviewed  CBC WITH DIFFERENTIAL/PLATELET - Abnormal; Notable for the following components:      Result Value   RBC 3.53 (*)    Hemoglobin 11.7 (*)    HCT 32.8 (*)    All other components within normal limits  COMPREHENSIVE METABOLIC PANEL WITH GFR - Abnormal; Notable for the following components:   Sodium 117 (*)    Chloride 86 (*)    CO2 19 (*)    Calcium 8.7 (*)    Albumin 3.3 (*)    All other components within normal limits  ETHANOL - Abnormal; Notable for the following components:   Alcohol, Ethyl (B) 28 (*)    All other components within normal limits  LACTIC ACID, PLASMA  MAGNESIUM   URINALYSIS, ROUTINE W REFLEX MICROSCOPIC  RAPID URINE DRUG SCREEN, HOSP PERFORMED  LACTIC ACID, PLASMA    EKG: EKG Interpretation Date/Time:  Thursday January 28 2024 10:36:37 EDT Ventricular Rate:  59 PR Interval:  244 QRS Duration:  83 QT Interval:  385 QTC Calculation: 382 R Axis:   -82  Text Interpretation: Sinus rhythm Prolonged PR interval Left anterior fascicular block Anteroseptal infarct, age indeterminate Since last tracing rate slower Confirmed by Cleotilde Rogue ([id number]) on 01/28/2024 11:07:54 AM  Radiology: [facility] Chest 2 View Result Date: 01/28/2024 EXAM: 2 VIEW(S) XRAY OF THE CHEST 01/28/2024 11:23:00 AM COMPARISON: None  available. CLINICAL HISTORY: Weakness. Per triage; Pt arrived via POV from PCP office for further treatment and evaluation of hyponatremia, hypotension, hyperkalemia and bradycardia. FINDINGS: LUNGS AND PLEURA: Hyperinflated lungs suggesting COPD. No focal pulmonary opacity. No pulmonary edema. No pleural effusion. No pneumothorax. HEART AND MEDIASTINUM: Ectatic and calcified aorta. No acute abnormality of the cardiac and mediastinal silhouettes. BONES AND SOFT TISSUES: Remote left rib fractures. Thoracic degenerative changes. No acute osseous abnormality. IMPRESSION: 1. No acute findings. 2. Hyperinflated lungs suggesting COPD. Electronically signed by: Waddell  Joan MD 01/28/2024 12:01 PM EDT RP Workstation: [id number]     Procedures   Medications Ordered in the ED  sodium chloride  0.9 % bolus 1,000 mL (has no administration in time range)                                    Medical Decision Making Patient sent here from endocrinology office for evaluation of worsening of his chronic hyponatremia.  He was noted to be hypotensive in the office but his pressures here have been reassuring.  He endorses weekly alcohol use and generalized weakness but denies any pain at this time.  No dizziness headache or visual changes.    I suspect worsening of his hyponatremia secondary to alcohol use, sepsis, worsening electrolyte abnormality  Amount and/or Complexity of Data Reviewed Labs: ordered.    Details: Patient has significant drop of his sodium level today 117 was 121 6 days ago.  Potassium unremarkable.  Blood alcohol today 28 Radiology: ordered.    Details: Chest x-ray no acute findings ECG/medicine tests: ordered.    Details: EKG shows sinus rhythm with prolonged PR interval and left anterior fascicular block Discussion of management or test interpretation with external provider(s): Worsening of his chronic hyponatremia, patient will need a hospital admission he is agreeable to plan.  Denies any  significant symptoms at this time.  He has not been hypotensive here and his lactic acid is reassuring.  Discussed with [facility] hospitalist, Dr. Evonnie who agrees to admit  Risk Decision regarding hospitalization.        Final diagnoses:  Hyponatremia    ED Discharge Orders     None          Herlinda Milling, PA-C 01/28/24 1305    Cleotilde Rogue, MD 01/29/24 [phone]

## 2024-01-28 NOTE — H&P (Signed)
 History and Physical    Patient: Travis Weeks FMW:[id number] DOB: [birth year] DOA: 01/28/2024 DOS: the patient was seen and examined on 01/28/2024 PCP: Verdie Johann LABOR, DO  Patient coming from: Home  Chief Complaint:  Chief Complaint  Patient presents with   hyponatremia   HPI: Jong Rickman is a 58 year old male with a history of alcohol dependence, hypertension, hyponatremia, COPD, tobacco abuse, GERD presenting from his endocrine physicians office due to soft blood pressures and hyponatremia.  The patient has had a history of chronic hyponatremia.  Apparently the patient had recent blood work obtained on 01/22/2024 which showed sodium 121.  He was noted to have an initial blood pressure of 90/54 with heart rate in the 56 in the endocrine office. The patient himself endorses generalized weakness for the past week.  He has had some occasional dizziness but denies any syncope or falls.  He denies any fevers, chills, chest pain, shortness breath, nausea, vomiting or diarrhea, abdominal pain.  He does complain that his abdomen feels bloated. He states that he is drinking at least 2-3 beers every other day.  He states that he previously drank four 20 ounce bottles of water, but he was told to cut back by his primary care provider to 2 bottles.  He is a difficult historian, but endorses compliance with his sodium chloride  tablets.  He states that he only eats 1 meal a day most days, but occasionally eats 2 meals a day.  He states he snacks throughout the day.  Continues to smoke 1 pack/day.  He uses THC intermittently.  He denies illicit drug use.  He denies any new medications, but seems to have poor insight regarding his medications. He recently visited the ED at [facility] with N/V and epigastric pain.  He had a CT of the abdomen and pelvis on 01/15/2024 at [facility] emergency department which showed persistent thickening and inflammation of the descending duodenum suggestive of duodenitis.  There is  moderately large colonic stool volume.  There is sigmoid diverticulosis without inflammation.  Otherwise, no other acute findings.  He was discharged home with Zofran , PPI. In the ED here, the patient was afebrile hemodynamically stable with oxygen saturation 100% room air.  WBC 9.4, hemoglobin 1.7, platelets 261.  Sodium 117, potassium 4.9, bicarbonate 19, serum creatinine 0.90.  LFTs unremarkable.  Alcohol level 28.  Chest x-ray showed hyperinflation.  Lactic acid 1.6.  EKG showed sinus rhythm with nonspecific T wave change.  There was first-degree AV block.  The patient was given 1 L NS and admitted for further evaluation and treatment.  Notably, the patient had blood work on 01/22/2024: TSH 0.230, free T40.97 Serum osmolarity 252 -Urine osmolarity 179 -Urine sodium 29 -A.m. cortisol 12.3  Review of Systems: As mentioned in the history of present illness. All other systems reviewed and are negative. Past Medical History:  Diagnosis Date   Anal fistula, complex, unspecified    in setting of significant perineal infection   COPD (chronic obstructive pulmonary disease) (HCC)    ETOH abuse    GERD (gastroesophageal reflux disease)    HTN (hypertension)    Stomach ulcer    Past Surgical History:  Procedure Laterality Date   BIOPSY  01/29/2017   Procedure: BIOPSY;  Surgeon: Shaaron Lamar HERO, MD;  [facility];  Service: Endoscopy;;  gastric   COLONOSCOPY WITH PROPOFOL  N/A 01/29/2017   Rourk: diverticulosis. next colonoscopy in 10 years   ESOPHAGOGASTRODUODENOSCOPY (EGD) WITH PROPOFOL  N/A 01/29/2017   Rourk:  erosive reflux esophagitis, gastric erosions (benign bx and no h.pylori), duodenal erosions   FINGER SURGERY Left    middle finger of L hand. pt unable to move it   Social History:  reports that he has been smoking cigarettes. He started smoking about 49 years ago. He has a 49.7 pack-year smoking history. He has been exposed to tobacco smoke. He has never used smokeless  tobacco. He reports current alcohol use of about 2.0 - 3.0 standard drinks of alcohol per week. He reports that he does not currently use drugs.  No Known Allergies  Family History  Problem Relation Age of Onset   Kidney disease Mother    Hypertension Mother    Diabetes Mother    Heart attack Mother    Colon cancer Neg Hx     Prior to Admission medications   Medication Sig Start Date End Date Taking? Authorizing Provider  albuterol (VENTOLIN HFA) 108 (90 Base) MCG/ACT inhaler Inhale 2 puffs into the lungs every 6 (six) hours as needed for wheezing or shortness of breath.   Yes [provider]  amLODipine  (NORVASC ) 5 MG tablet Take 1 tablet (5 mg total) by mouth daily. 02/18/20  Yes Lucio Litsey, Alm, MD  famotidine  (PEPCID ) 20 MG tablet Take 20 mg by mouth 2 (two) times daily.   Yes [provider]  folic acid  (FOLVITE ) 1 MG tablet Take 1 mg by mouth daily.   Yes [provider]  levothyroxine  (SYNTHROID ) 25 MCG tablet Take 1 tablet (25 mcg total) by mouth daily before breakfast. 01/26/24  Yes Nida, Ethelle ORN, MD  lisinopril  (ZESTRIL ) 20 MG tablet Take 1 tablet by mouth daily. 08/11/23  Yes [provider]  magnesium  oxide (MAG-OX) 400 MG tablet Take 500 mg by mouth daily.   Yes [provider]  metoprolol tartrate (LOPRESSOR) 25 MG tablet Take 25 mg by mouth 2 (two) times daily.   Yes [provider]  omeprazole (PRILOSEC) 20 MG capsule Take 20 mg by mouth 2 (two) times daily. 01/15/24  Yes [provider]  polyethylene glycol powder (MIRALAX ) 17 GM/SCOOP powder Mix one capful in 6 ounces of water twice daily until soft stool. Then continue one capful once to twice daily to maintain regular soft stools. 10/26/23  Yes Ezzard Sonny RAMAN, PA-C  PYLERA 140-125-125 MG CAPS Take 3 capsules by mouth 4 (four) times daily. 01/19/24  Yes [provider]  QUEtiapine  (SEROQUEL ) 100 MG tablet Take 100 mg by mouth at bedtime.   Yes [provider]  sodium chloride  1 g tablet Take 2 tablets (2 g total) by mouth 2 (two) times daily with a meal. 01/28/24  Yes Nida, Ethelle ORN, MD  ondansetron  (ZOFRAN -ODT) 4 MG disintegrating tablet Take 4 mg by mouth every 8 (eight) hours as needed. Patient not taking: Reported on 01/28/2024 01/15/24   [provider]    Physical Exam: Vitals:   01/28/24 1028 01/28/24 1100 01/28/24 1130 01/28/24 1200  BP:  122/78 (!) 119/91 105/66  Pulse: 62 64 69 71  Resp:  15    Temp:      TempSrc:      SpO2: 100% 100% 100% 99%  Weight:      Height:       GENERAL:  A&O x 3, NAD, well developed, cooperative, follows commands HEENT: [place]/AT, No thrush, No icterus, No oral ulcers Neck:  No neck mass, No meningismus, soft, supple CV: RRR, no S3, no S4, no rub, no JVD Lungs: Diminished  breath sounds, scattered rales.  No wheezing. Abd: soft/NT +BS, nondistended Ext: No edema, no lymphangitis, no cyanosis, no rashes Neuro:  CN II-XII intact, strength 4/5 in RUE, RLE, strength 4/5 LUE, LLE; sensation intact bilateral; no dysmetria; babinski equivocal  Data Reviewed: Data reviewed above in history  Assessment and Plan: Hypo-osmolar hyponatremia -repeat serum osm -urine osm -urine Na -continue IV isotonic saline for now -continue NaCl tabs -primarily due to volume depletion and poor solute intake -random cortisol -lipid panel  Alcohol dependence -Etoh level in ED = 28 -CIWA protocol -UDS  Essential HTN -holding metoprolol, amlodipine  and lisinopril  due to soft BPs  Hypothyroidism - Continue Synthroid   GERD/duodenitis -continue pantoprazole       Advance Care Planning: FULL  Consults: none  Family Communication: none  Severity of Illness: The appropriate patient status for this patient is INPATIENT. Inpatient status is judged to be reasonable and necessary in order to provide the required intensity of service to ensure the patient's safety. The patient's presenting  symptoms, physical exam findings, and initial radiographic and laboratory data in the context of their chronic comorbidities is felt to place them at high risk for further clinical deterioration. Furthermore, it is not anticipated that the patient will be medically stable for discharge from the hospital within 2 midnights of admission.   * I certify that at the point of admission it is my clinical judgment that the patient will require inpatient hospital care spanning beyond 2 midnights from the point of admission due to high intensity of service, high risk for further deterioration and high frequency of surveillance required.*  Author: Alm Schneider, MD 01/28/2024 12:54 PM  For on call review [email or web address].

## 2024-01-28 NOTE — ED Notes (Signed)
 Pt care taken, resting, awaiting admission.

## 2024-01-28 NOTE — ED Notes (Signed)
 See triage notes. Pt a/o. Denies pain/gu. C/o being weak all over. Denies dizziness.

## 2024-01-28 NOTE — Progress Notes (Signed)
 01/28/2024, 10:08 AM   Endocrinology follow-up note  Subjective:    Patient ID: Travis Weeks, male    DOB: [birth year], PCP Verdie Johann LABOR, DO   Past Medical History:  Diagnosis Date   Anal fistula, complex, unspecified    in setting of significant perineal infection   COPD (chronic obstructive pulmonary disease) (HCC)    ETOH abuse    GERD (gastroesophageal reflux disease)    HTN (hypertension)    Stomach ulcer    Past Surgical History:  Procedure Laterality Date   BIOPSY  01/29/2017   Procedure: BIOPSY;  Surgeon: Shaaron Lamar HERO, MD;  [facility];  Service: Endoscopy;;  gastric   COLONOSCOPY WITH PROPOFOL  N/A 01/29/2017   Rourk: diverticulosis. next colonoscopy in 10 years   ESOPHAGOGASTRODUODENOSCOPY (EGD) WITH PROPOFOL  N/A 01/29/2017   Rourk: erosive reflux esophagitis, gastric erosions (benign bx and no h.pylori), duodenal erosions   FINGER SURGERY Left    middle finger of L hand. pt unable to move it   Social History   Socioeconomic History   Marital status: Single    Spouse name: Not on file   Number of children: Not on file   Years of education: Not on file   Highest education level: Not on file  Occupational History   Occupation: Advice worker  Tobacco Use   Smoking status: Every Day    Current packs/day: 1.00    Average packs/day: 1 pack/day for 49.7 years (49.7 ttl pk-yrs)    Types: Cigarettes    Start date: 1976    Passive exposure: Current   Smokeless tobacco: Never  Vaping Use   Vaping status: Never Used  Substance and Sexual Activity   Alcohol use: Yes    Alcohol/week: 2.0 - 3.0 standard drinks of alcohol    Types: 2 - 3 Cans of beer per week    Comment: several days per week (01/06/17). as of 10/26/23, has a beer when watching a game, not all the time   Drug use: Not Currently    Comment: UDS positive for cocaine 08/2016. No drugs in several months (as of 12/2016).   Sexual  activity: Not Currently  Other Topics Concern   Not on file  Social History Narrative   Not on file   Social Drivers of Health   Financial Resource Strain: Low Risk  (05/29/2023)   Received from [facility]   Overall Financial Resource Strain (CARDIA)    Difficulty of Paying Living Expenses: Not hard at all  Food Insecurity: No Food Insecurity (06/08/2023)   Received from [facility]   Hunger Vital Sign    Within the past 12 months, you worried that your food would run out before you got the money to buy more.: Never true    Within the past 12 months, the food you bought just didn't last and you didn't have money to get more.: Never true  Transportation Needs: No Transportation Needs (06/08/2023)   Received from [facility] - Transportation    Lack of Transportation (Medical): No    Lack of Transportation (Non-Medical): No  Physical Activity: Insufficiently Active (02/18/2023)   Received from [facility]   Exercise  Vital Sign    On average, how many days per week do you engage in moderate to strenuous exercise (like a brisk walk)?: 2 days    On average, how many minutes do you engage in exercise at this level?: 10 min  Stress: No Stress Concern Present (06/08/2023)   Received from [facility] - Occupational Stress Questionnaire    Feeling of Stress : Not at all  Social Connections: Moderately Isolated (06/08/2023)   Received from [facility]   Social Connection and Isolation Panel    In a typical week, how many times do you talk on the phone with family, friends, or neighbors?: More than three times a week    How often do you get together with friends or relatives?: Never    How often do you attend church or religious services?: Never    Do you belong to any clubs or organizations such as church groups, unions, fraternal or athletic groups, or school groups?: Yes    How often do you attend meetings of the  clubs or organizations you belong to?: Never    Are you married, widowed, divorced, separated, never married, or living with a partner?: Never married   Family History  Problem Relation Age of Onset   Kidney disease Mother    Hypertension Mother    Diabetes Mother    Heart attack Mother    Colon cancer Neg Hx    Outpatient Encounter Medications as of 01/28/2024  Medication Sig   albuterol (VENTOLIN HFA) 108 (90 Base) MCG/ACT inhaler Inhale 2 puffs into the lungs every 6 (six) hours as needed for wheezing or shortness of breath.   amLODipine  (NORVASC ) 5 MG tablet Take 1 tablet (5 mg total) by mouth daily.   diclofenac (VOLTAREN) 75 MG EC tablet Take 75 mg by mouth 2 (two) times daily.   famotidine  (PEPCID ) 20 MG tablet Take 20 mg by mouth 2 (two) times daily.   folic acid  (FOLVITE ) 1 MG tablet Take 1 mg by mouth daily.   levothyroxine  (SYNTHROID ) 25 MCG tablet Take 1 tablet (25 mcg total) by mouth daily before breakfast.   lisinopril  (ZESTRIL ) 20 MG tablet Take 1 tablet by mouth daily.   magnesium  oxide (MAG-OX) 400 MG tablet Take 500 mg by mouth daily.   metoprolol tartrate (LOPRESSOR) 25 MG tablet Take 25 mg by mouth 2 (two) times daily.   polyethylene glycol powder (MIRALAX ) 17 GM/SCOOP powder Mix one capful in 6 ounces of water twice daily until soft stool. Then continue one capful once to twice daily to maintain regular soft stools.   QUEtiapine  (SEROQUEL ) 100 MG tablet Take 100 mg by mouth at bedtime.   sodium chloride  1 g tablet Take 2 tablets (2 g total) by mouth 3 (three) times daily with meals.   zinc gluconate 50 MG tablet Take 50 mg by mouth daily.   No facility-administered encounter medications on file as of 01/28/2024.   ALLERGIES: No Known Allergies  VACCINATION STATUS:  There is no immunization history on file for this patient.  HPI Travis Weeks is 58 y.o. male who presents today with a medical history as above. he is being seen in follow-up after he was seen  in consultation for hyponatremia. Patient is known to have chronic hyponatremia, hyperosmolality, protein calorie malnutrition, alcohol abuse.  While undergoing further investigation, he was given salt supplements, however developed further hyponatremia down to 121 on his previsit labs.  These  labs also showed multiple electrolyte abnormalities including hypochloremia and hyperkalemia. Patient was escorted back [facility] clinic where he was also found to have hypotension and dehydrated.  Patient denies headaches and nausea, vomiting.  - He is a suboptimal historian with possible cognitive deficit.  Patient with significant medical history of alcoholism, GERD, hypertension, COPD-continues to smoke heavily. Review of his medical records show hyponatremia at least from 2018.  He also had intermittent hypokalemia although most recently his potassium has been stabilizing.  His recent labs show mild hypothyroidism for which levothyroxine  was ordered, however he did not pick up. Patient denies history of CHF, not sure if he has cirrhosis.  He does have chronic hypoalbuminemia.  His sodium ranged between 120 and 126 for the last 4 years. Admittedly, he does not eat enough protein, continues to drink alcohol. His other medical problem includes hypertension for which he takes amlodipine  5 mg p.o. daily, lisinopril  20 mg p.o. daily, bronchodilators.  He is also on antidepressant Seroquel  and MiraLAX .  He denies nausea/vomiting, diarrhea. He is not on a diuretic.  Review of Systems  Constitutional: +mildly fluctuating body weight ,  no fatigue, no subjective hyperthermia, no subjective hypothermia  Skin: no rashes Neurological: no tremors, no numbness, no tingling, no dizziness Psychiatric: + depression, no anxiety  Objective:       01/28/2024   10:05 AM 01/28/2024   10:04 AM 01/28/2024    9:20 AM  Vitals with BMI  Height 5' 10  5' 10  Weight 141 lbs 13 oz  141 lbs 13 oz  BMI 20.35  20.35  Systolic   110 90  Diastolic  81 54  Pulse  65 56    BP (!) 90/54   Pulse (!) 56   Ht 5' 10 (1.778 m)   Wt 141 lb 12.8 oz (64.3 kg)   BMI 20.35 kg/m   Wt Readings from Last 3 Encounters:  01/28/24 141 lb 12.8 oz (64.3 kg)  01/28/24 141 lb 12.8 oz (64.3 kg)  12/29/23 140 lb 12.8 oz (63.9 kg)    Physical Exam  Constitutional:  Body mass index is 20.35 kg/m.,  not in acute distress,  + disheveled Eyes: PERRLA, EOMI, no exophthalmos  Cardiovascular: +Hypotensive, bradycardic  Respiratory:  + Diffuse wheezes on bilateral lung fields   Musculoskeletal: no gross deformities, strength intact in all four extremities, no peripheral edema Skin: + Dry skin with poor  turgor,  warm, no rashes Neurological: no tremor with outstretched hands, Deep tendon reflexes normal in bilateral lower extremities.  CMP ( most recent) CMP     Component Value Date/Time   NA 121 (L) 01/22/2024 0835   K 5.4 (H) 01/22/2024 0835   CL 88 (L) 01/22/2024 0835   CO2 20 01/22/2024 0835   GLUCOSE 83 01/22/2024 0835   GLUCOSE 86 02/18/2020 0614   BUN 9 01/22/2024 0835   CREATININE 0.90 01/22/2024 0835   CREATININE 0.67 (L) 09/03/2016 1531   CALCIUM 9.0 01/22/2024 0835   PROT 6.5 01/22/2024 0835   ALBUMIN 3.7 (L) 01/22/2024 0835   AST 25 01/22/2024 0835   ALT 13 01/22/2024 0835   ALKPHOS 118 01/22/2024 0835   BILITOT 0.5 01/22/2024 0835   EGFR 98 01/22/2024 0835   GFRNONAA >60 02/18/2020 0614     Diabetic Labs (most recent): Lab Results  Component Value Date   HGBA1C 4.6 (L) 02/15/2020   HGBA1C 4.2 09/03/2016     Lipid Panel ( most recent) Lipid Panel  Component Value Date/Time   CHOL 121 09/05/2016 0405   TRIG 59 09/05/2016 0405   HDL 67 09/05/2016 0405   CHOLHDL 1.8 09/05/2016 0405   VLDL 12 09/05/2016 0405   LDLCALC 42 09/05/2016 0405      Lab Results  Component Value Date   TSH 7.230 (H) 01/22/2024   TSH 1.795 09/04/2016   TSH 1.76 09/03/2016   FREET4 0.97 01/22/2024       Assessment & Plan:   1.  Hypovolemia, hypo-osmolality and hyponatremia 2.  Protein calorie malnutrition  3.  Alcohol abuse 4.  Hyperkalemia 5.  Hypothyroidism   - I have reviewed his new and available  records and clinically evaluated the patient. - Based on these reviews, he has chronic hypoosmolar hyponatremia, likely related to [facility] [place] versus SIADH, or a combination of these 2.  His previsit labs show hyperosmolality with inappropriately normal urine osmolality supporting a diagnosis of SIADH.  - He is pressing emergencies worsening hyponatremia, hyperkalemia, hypotension/bradycardia on the background of chronic protein calorie malnutrition.  Patient was sent to ER for better evaluation and treatment via [facility]. Patient will need thoracic imaging screen for occult malignancy/paraneoplastic syndrome since his risk is very high given multiple substance abuse including 50+ pack years of smoking.  The general approach for SIADH would be free water restrictions as much as possible. Admittedly, he has not been consistent taking his oral supplements. If he is discharged, he is advised to take 2 g of sodium 2 times a day with meals.  His a.m. cortisol is normal, does have mild hypothyroidism for which low-dose levothyroxine  was prescribed.  -I have advised patient to cut his free water by half, increase his other liquids including milk, electrolyte drinks, juice for more solute intake.  He is also encouraged to eat more food including meat sandwiches. It is likely that patient has protein calorie malnutrition as evidenced by hypoalbuminemia.    -Patient is at risk of liver disease, cardiomyopathy, CKD, malignancies, etc.    He need social work assessment for polysubstance abuse.   The patient was counseled on the dangers of tobacco use, and was advised to quit.  Reviewed strategies to maximize success, including removing cigarettes and smoking materials from environment.   - he is  advised to maintain close follow up with Verdie Scull A, DO for primary care needs.   I spent  26  minutes in the care of the patient today including review of labs from Thyroid  Function, CMP, and other relevant labs ; imaging/biopsy records (current and previous including abstractions from other facilities); face-to-face time discussing  his lab results and symptoms, medications doses, his options of short and long term treatment based on the latest standards of care / guidelines;   and documenting the encounter.  Travis Weeks  participated in the discussions, expressed understanding, and voiced agreement with the above plans.  All questions were answered to his satisfaction. he is encouraged to contact clinic should he have any questions or concerns prior to his return visit.  Follow up plan: Return in about 2 weeks (around 02/11/2024), or To ER for Better Evaluation for Hyponatremia, hyperkalemia, Hyperosmolality, for F/U with Pre-visit Labs.   Ranny Earl, MD [facility] [address] [place], [place] [address] Phone: [phone]  Fax: [phone]     01/28/2024, 10:08 AM  This note was partially dictated with voice recognition software. Similar sounding words can be transcribed inadequately or may not  be corrected  upon review.

## 2024-01-28 NOTE — Hospital Course (Signed)
 58 year old male with a history of alcohol dependence, hypertension, hyponatremia, COPD, tobacco abuse, GERD presenting from his endocrine physicians office due to soft blood pressures and hyponatremia.  The patient has had a history of chronic hyponatremia.  Apparently the patient had recent blood work obtained on 01/22/2024 which showed sodium 121.  He was noted to have an initial blood pressure of 90/54 with heart rate in the 56 in the endocrine office. The patient himself endorses generalized weakness for the past week.  He has had some occasional dizziness but denies any syncope or falls.  He denies any fevers, chills, chest pain, shortness breath, nausea, vomiting or diarrhea, abdominal pain.  He does complain that his abdomen feels bloated. He states that he is drinking at least 2-3 beers every other day.  He states that he previously drank four 20 ounce bottles of water, but he was told to cut back by his primary care provider to 2 bottles.  He is a difficult historian, but endorses compliance with his sodium chloride  tablets.  He states that he only eats 1 meal a day most days, but occasionally eats 2 meals a day.  He states he snacks throughout the day.  Continues to smoke 1 pack/day.  He uses THC intermittently.  He denies illicit drug use.  He denies any new medications, but seems to have poor insight regarding his medications. He recently visited the ED at [facility] with N/V and epigastric pain.  He had a CT of the abdomen and pelvis on 01/15/2024 at [facility] emergency department which showed persistent thickening and inflammation of the descending duodenum suggestive of duodenitis.  There is moderately large colonic stool volume.  There is sigmoid diverticulosis without inflammation.  Otherwise, no other acute findings.  He was discharged home with Zofran , PPI. In the ED here, the patient was afebrile hemodynamically stable with oxygen saturation 100% room air.  WBC 9.4, hemoglobin 1.7, platelets 261.   Sodium 117, potassium 4.9, bicarbonate 19, serum creatinine 0.90.  LFTs unremarkable.  Alcohol level 28.  Chest x-ray showed hyperinflation.  Lactic acid 1.6.  EKG showed sinus rhythm with nonspecific T wave change.  There was first-degree AV block.  The patient was given 1 L NS and admitted for further evaluation and treatment.  Notably, the patient had blood work on 01/22/2024: TSH 0.230, free T40.97 Serum osmolarity 252 -Urine osmolarity 179 -Urine sodium 29 -A.m. cortisol 12.3

## 2024-01-28 NOTE — ED Triage Notes (Signed)
 Pt arrived via POV from PCP office for further treatment and evaluation of hyponatremia, hypotension, hyperkalemia and bradycardia.

## 2024-01-29 DIAGNOSIS — E861 Hypovolemia: Secondary | ICD-10-CM | POA: Diagnosis not present

## 2024-01-29 DIAGNOSIS — E871 Hypo-osmolality and hyponatremia: Secondary | ICD-10-CM | POA: Diagnosis not present

## 2024-01-29 DIAGNOSIS — Z720 Tobacco use: Secondary | ICD-10-CM | POA: Diagnosis not present

## 2024-01-29 LAB — CORTISOL-AM, BLOOD: Cortisol - AM: 3.4 ug/dL — ABNORMAL LOW (ref 6.7–22.6)

## 2024-01-29 LAB — MAGNESIUM: Magnesium: 1.8 mg/dL (ref 1.7–2.4)

## 2024-01-29 LAB — CBC
HCT: 30.1 % — ABNORMAL LOW (ref 39.0–52.0)
Hemoglobin: 10.6 g/dL — ABNORMAL LOW (ref 13.0–17.0)
MCH: 32.6 pg (ref 26.0–34.0)
MCHC: 35.2 g/dL (ref 30.0–36.0)
MCV: 92.6 fL (ref 80.0–100.0)
Platelets: 239 K/uL (ref 150–400)
RBC: 3.25 MIL/uL — ABNORMAL LOW (ref 4.22–5.81)
RDW: 13.6 % (ref 11.5–15.5)
WBC: 6.5 K/uL (ref 4.0–10.5)
nRBC: 0 % (ref 0.0–0.2)

## 2024-01-29 LAB — COMPREHENSIVE METABOLIC PANEL WITH GFR
ALT: 13 U/L (ref 0–44)
AST: 24 U/L (ref 15–41)
Albumin: 2.8 g/dL — ABNORMAL LOW (ref 3.5–5.0)
Alkaline Phosphatase: 84 U/L (ref 38–126)
Anion gap: 7 (ref 5–15)
BUN: 9 mg/dL (ref 6–20)
CO2: 21 mmol/L — ABNORMAL LOW (ref 22–32)
Calcium: 8.3 mg/dL — ABNORMAL LOW (ref 8.9–10.3)
Chloride: 97 mmol/L — ABNORMAL LOW (ref 98–111)
Creatinine, Ser: 0.79 mg/dL (ref 0.61–1.24)
GFR, Estimated: >60 mL/min (ref >60)
Glucose, Bld: 85 mg/dL (ref 70–99)
Potassium: 4.2 mmol/L (ref 3.5–5.1)
Sodium: 125 mmol/L — ABNORMAL LOW (ref 135–145)
Total Bilirubin: 1 mg/dL (ref 0.0–1.2)
Total Protein: 5.8 g/dL — ABNORMAL LOW (ref 6.5–8.1)

## 2024-01-29 LAB — SODIUM
Sodium: 125 mmol/L — ABNORMAL LOW (ref 135–145)
Sodium: 125 mmol/L — ABNORMAL LOW (ref 135–145)
Sodium: 126 mmol/L — ABNORMAL LOW (ref 135–145)

## 2024-01-29 LAB — OSMOLALITY, URINE: Osmolality, Ur: 106 mosm/kg — ABNORMAL LOW (ref 300–900)

## 2024-01-29 LAB — LIPID PANEL
Cholesterol: 107 mg/dL (ref 0–200)
HDL: 60 mg/dL (ref >40)
LDL Cholesterol: 40 mg/dL (ref 0–99)
Total CHOL/HDL Ratio: 1.8 ratio
Triglycerides: 33 mg/dL (ref <150)
VLDL: 7 mg/dL (ref 0–40)

## 2024-01-29 LAB — PROCALCITONIN: Procalcitonin: <0.1 ng/mL

## 2024-01-29 LAB — LACTIC ACID, PLASMA
Lactic Acid, Venous: 1.4 mmol/L (ref 0.5–1.9)
Lactic Acid, Venous: 0.6 mmol/L (ref 0.5–1.9)

## 2024-01-29 LAB — HIV ANTIBODY (ROUTINE TESTING W REFLEX): HIV Screen 4th Generation wRfx: NONREACTIVE

## 2024-01-29 MED ORDER — COSYNTROPIN 0.25 MG IJ SOLR
0.2500 mg | Freq: Once | INTRAMUSCULAR | Status: AC
  Administered 2024-01-30: 0.25 mg via INTRAVENOUS
  Filled 2024-01-29 (×2): qty 0.25

## 2024-01-29 MED ORDER — LACTATED RINGERS IV BOLUS
1000.0000 mL | Freq: Once | INTRAVENOUS | Status: AC
  Administered 2024-01-29: 1000 mL via INTRAVENOUS

## 2024-01-29 MED ORDER — SODIUM CHLORIDE 0.9 % IV SOLN: INTRAVENOUS | Status: AC

## 2024-01-29 NOTE — Progress Notes (Signed)
 tInitial Nutrition Assessment  DOCUMENTATION CODES:   Non-severe (moderate) malnutrition in context of social or environmental circumstances  INTERVENTION:   Magic cup TID with meals, each supplement provides 290 kcal and 9 grams of protein Double protein portions with meals MVI with minerals daily  NUTRITION DIAGNOSIS:   Moderate Malnutrition related to social / environmental circumstances (alcohol dependence) as evidenced by moderate muscle depletion, moderate fat depletion.  GOAL:   Patient will meet greater than or equal to 90% of their needs  MONITOR:   PO intake, Supplement acceptance  REASON FOR ASSESSMENT:   Consult Assessment of nutrition requirement/status  ASSESSMENT:   58 yo male admitted with hyponatremia. PMH includes alcohol dependence, HTN, hyponatremia, COPD, tobacco abuse, GERD.  Patient states he has been eating okay, but does not really like the hospital food. He thinks his weight has been stable over the past year. Usual wt 138 lbs. He does not like Ensure or Boost supplements, but agreed to try magic cups with meals. He typically eats once or twice per day at home. Encouraged patient to eat 3 meals per day with snacks between meals to maximize oral intake. Per review of usual weights, he has had a 6% weight loss over the past 3 months, which is not significant for the time frame.   Labs reviewed. Na 122-->125-->125-->126  Medications reviewed and include folic acid , mag-ox, MVI, protonix , sodium chloride  tablet, thiamine . IVF: NS @ 75 ml/h  Patient meets criteria for moderate malnutrition, given moderate depletion of muscle and subcutaneous fat mass.  NUTRITION - FOCUSED PHYSICAL EXAM:  Flowsheet Row Most Recent Value  Orbital Region Moderate depletion  Upper Arm Region Moderate depletion  Thoracic and Lumbar Region Mild depletion  Buccal Region Severe depletion  Temple Region Severe depletion  Clavicle Bone Region Moderate depletion  Clavicle  and Acromion Bone Region Mild depletion  Scapular Bone Region Mild depletion  Dorsal Hand Moderate depletion  Patellar Region Moderate depletion  Anterior Thigh Region Moderate depletion  Posterior Calf Region Moderate depletion  Edema (RD Assessment) None  Hair Reviewed  Eyes Reviewed  Mouth Reviewed  Skin Reviewed  Nails Reviewed    Diet Order:   Diet Order             Diet regular Room service appropriate? Yes; Fluid consistency: Thin  Diet effective now                   EDUCATION NEEDS:   Not appropriate for education at this time  Skin:  Skin Assessment: Reviewed RN Assessment  Last BM:  9/18  Height:   Ht Readings from Last 1 Encounters:  01/28/24 5' 10 (1.778 m)    Weight:   Wt Readings from Last 1 Encounters:  01/29/24 61.6 kg    Ideal Body Weight:  75.5 kg  BMI:  Body mass index is 19.49 kg/m.  Estimated Nutritional Needs:   Kcal:  1900-2100  Protein:  90-110 gm  Fluid:  1.9 L   Suzen HUNT RD, LDN, CNSC Contact via secure chat. If unavailable, use group chat RD Inpatient.

## 2024-01-29 NOTE — Plan of Care (Signed)
   Problem: Health Behavior/Discharge Planning: Goal: Ability to manage health-related needs will improve Outcome: Progressing   Problem: Clinical Measurements: Goal: Respiratory complications will improve Outcome: Progressing Goal: Cardiovascular complication will be avoided Outcome: Progressing   Problem: Activity: Goal: Risk for activity intolerance will decrease Outcome: Progressing

## 2024-01-29 NOTE — Progress Notes (Signed)
 PROGRESS NOTE  Travis Weeks FMW:[id number] DOB: Aug 01, 1965 DOA: 01/28/2024 PCP: Verdie Johann LABOR, DO  Brief History:  58 year old male with a history of alcohol dependence, hypertension, hyponatremia, COPD, tobacco abuse, GERD presenting from his endocrine physicians office due to soft blood pressures and hyponatremia.  The patient has had a history of chronic hyponatremia.  Apparently the patient had recent blood work obtained on 01/22/2024 which showed sodium 121.  He was noted to have an initial blood pressure of 90/54 with heart rate in the 56 in the endocrine office. The patient himself endorses generalized weakness for the past week.  He has had some occasional dizziness but denies any syncope or falls.  He denies any fevers, chills, chest pain, shortness breath, nausea, vomiting or diarrhea, abdominal pain.  He does complain that his abdomen feels bloated. He states that he is drinking at least 2-3 beers every other day.  He states that he previously drank four 20 ounce bottles of water, but he was told to cut back by his primary care provider to 2 bottles.  He is a difficult historian, but endorses compliance with his sodium chloride  tablets.  He states that he only eats 1 meal a day most days, but occasionally eats 2 meals a day.  He states he snacks throughout the day.  Continues to smoke 1 pack/day.  He uses THC intermittently.  He denies illicit drug use.  He denies any new medications, but seems to have poor insight regarding his medications. He recently visited the ED at [facility] with N/V and epigastric pain.  He had a CT of the abdomen and pelvis on 01/15/2024 at [facility] emergency department which showed persistent thickening and inflammation of the descending duodenum suggestive of duodenitis.  There is moderately large colonic stool volume.  There is sigmoid diverticulosis without inflammation.  Otherwise, no other acute findings.  He was discharged home with Zofran , PPI. In the  ED here, the patient was afebrile hemodynamically stable with oxygen saturation 100% room air.  WBC 9.4, hemoglobin 1.7, platelets 261.  Sodium 117, potassium 4.9, bicarbonate 19, serum creatinine 0.90.  LFTs unremarkable.  Alcohol level 28.  Chest x-ray showed hyperinflation.  Lactic acid 1.6.  EKG showed sinus rhythm with nonspecific T wave change.  There was first-degree AV block.  The patient was given 1 L NS and admitted for further evaluation and treatment.  Notably, the patient had blood work on 01/22/2024: TSH 0.230, free T40.97 Serum osmolarity 252 -Urine osmolarity 179 -Urine sodium 29 -A.m. cortisol 12.3   Assessment/Plan: Hypo-osmolar hyponatremia -repeat serum osm--251 -urine osm--pending -urine Na <30 -continue IV isotonic saline for now -continue NaCl tabs -primarily due to volume depletion and poor solute intake -AM cortisol 3.4 -lipid panel LDL 40, TG 33, TC 107  Hypotension -UA--neg -blood cultures -repeat lactate -afebrile- -rebolus fluid, continue maintenance IVF -personally reviewed CXR--no infiltrates -pt is asymptomatic   Alcohol dependence -Etoh level in ED = 28 -CIWA protocol -UDS--neg   Essential HTN hx -now soft  BPs/hypotension -holding metoprolol, amlodipine  and lisinopril  due to soft BPs   Hypothyroidism - Continue Synthroid  - 9/12 TSH 7.230, FT4 = 0.97   GERD/duodenitis -continue pantoprazole       Family Communication:  no  Family at bedside  Consultants:  none  Code Status:  FULL   DVT Prophylaxis:   [place] Lovenox    Procedures: As Listed in Progress Note Above  Antibiotics: None  Subjective: Patient denies fevers, chills, headache, chest pain, dyspnea, nausea, vomiting, diarrhea, abdominal pain, dysuria, hematuria, hematochezia, and melena.   Objective: Vitals:   01/29/24 0906 01/29/24 0917 01/29/24 0922 01/29/24 0923  BP: (!) 80/51 (!) 85/72 (!) 92/56 (!) 85/57  Pulse: 99     Resp: 19     Temp: (!) 97.5 F  (36.4 C)     TempSrc: Oral     SpO2: 94%     Weight:      Height:        Intake/Output Summary (Last 24 hours) at 01/29/2024 0941 Last data filed at 01/29/2024 0535 Gross per 24 hour  Intake 1100 ml  Output 1500 ml  Net -400 ml   Weight change:  Exam:  General:  Pt is alert, follows commands appropriately, not in acute distress HEENT: No icterus, No thrush, No neck mass, [place]/AT Cardiovascular: RRR, S1/S2, no rubs, no gallops Respiratory: diminished BS, CTA bilaterally, no wheezing, no crackles, no rhonchi Abdomen: Soft/+BS, non tender, non distended, no guarding Extremities: No edema, No lymphangitis, No petechiae, No rashes, no synovitis   Data Reviewed: I have personally reviewed following labs and imaging studies Basic Metabolic Panel: Recent Labs  Lab 01/28/24 1035 01/28/24 1623 01/28/24 1956 01/28/24 2359 01/29/24 0333 01/29/24 0810  NA 117* 121* 122* 125* 125* 126*  K 4.9  --   --   --  4.2  --   CL 86*  --   --   --  97*  --   CO2 19*  --   --   --  21*  --   GLUCOSE 72  --   --   --  85  --   BUN 8  --   --   --  9  --   CREATININE 0.90  --   --   --  0.79  --   CALCIUM 8.7*  --   --   --  8.3*  --   MG 1.9  --   --   --  1.8  --    Liver Function Tests: Recent Labs  Lab 01/28/24 1035 01/29/24 0333  AST 30 24  ALT 14 13  ALKPHOS 92 84  BILITOT 0.6 1.0  PROT 6.8 5.8*  ALBUMIN 3.3* 2.8*   No results for input(s): LIPASE, AMYLASE in the last 168 hours. No results for input(s): AMMONIA in the last 168 hours. Coagulation Profile: No results for input(s): INR, PROTIME in the last 168 hours. CBC: Recent Labs  Lab 01/28/24 1035 01/29/24 0333  WBC 9.4 6.5  NEUTROABS 5.1  --   HGB 11.7* 10.6*  HCT 32.8* 30.1*  MCV 92.9 92.6  PLT 261 239   Cardiac Enzymes: No results for input(s): CKTOTAL, CKMB, CKMBINDEX, TROPONINI in the last 168 hours. BNP: Invalid input(s): POCBNP CBG: No results for input(s): GLUCAP in the last 168  hours. HbA1C: No results for input(s): HGBA1C in the last 72 hours. Urine analysis:    Component Value Date/Time   COLORURINE STRAW (A) 01/28/2024 1712   APPEARANCEUR CLEAR 01/28/2024 1712   LABSPEC 1.002 (L) 01/28/2024 1712   PHURINE 5.0 01/28/2024 1712   GLUCOSEU NEGATIVE 01/28/2024 1712   HGBUR NEGATIVE 01/28/2024 1712   BILIRUBINUR NEGATIVE 01/28/2024 1712   KETONESUR NEGATIVE 01/28/2024 1712   PROTEINUR NEGATIVE 01/28/2024 1712   NITRITE NEGATIVE 01/28/2024 1712   LEUKOCYTESUR NEGATIVE 01/28/2024 1712   Sepsis Labs: @LABRCNTIP (procalcitonin:4,lacticidven:4) )No results found for this or any previous visit (from the  past 240 hours).   Scheduled Meds:  enoxaparin  (LOVENOX ) injection  40 mg Subcutaneous Q24H   folic acid   1 mg Oral Daily   levothyroxine   25 mcg Oral QAC breakfast   magnesium  oxide  400 mg Oral Daily   multivitamin with minerals  1 tablet Oral Daily   pantoprazole   40 mg Oral BID   QUEtiapine   100 mg Oral QHS   sodium chloride   2 g Oral BID WC   thiamine   100 mg Oral Daily   Or   thiamine   100 mg Intravenous Daily   Continuous Infusions:  sodium chloride  75 mL/hr at 01/29/24 0541    Procedures/Studies: DG Chest 2 View Result Date: 01/28/2024 EXAM: 2 VIEW(S) XRAY OF THE CHEST 01/28/2024 11:23:00 AM COMPARISON: None available. CLINICAL HISTORY: Weakness. Per triage; Pt arrived via POV from PCP office for further treatment and evaluation of hyponatremia, hypotension, hyperkalemia and bradycardia. FINDINGS: LUNGS AND PLEURA: Hyperinflated lungs suggesting COPD. No focal pulmonary opacity. No pulmonary edema. No pleural effusion. No pneumothorax. HEART AND MEDIASTINUM: Ectatic and calcified aorta. No acute abnormality of the cardiac and mediastinal silhouettes. BONES AND SOFT TISSUES: Remote left rib fractures. Thoracic degenerative changes. No acute osseous abnormality. IMPRESSION: 1. No acute findings. 2. Hyperinflated lungs suggesting COPD. Electronically  signed by: Waddell Calk MD 01/28/2024 12:01 PM EDT RP Workstation: [id number]    Alm Schneider, DO  [facility]  If 7PM-7AM, please contact night-coverage [email or web address] Password [email or web address] 01/29/2024, 9:41 AM   LOS: 1 day

## 2024-01-29 NOTE — Progress Notes (Signed)
 At 0911: Made Dr Tat aware, BP 78/50 rechecked 80/51 HR 99. NS at 75ml/hr, no pain meds or bp meds given overnight or this am. Has been drinking plenty of fluids. Alert and oriented x 4, no dizziness reported.   9082: BP rechecked 80/51 HR 99, Dr.  Evonnie present and assessing patient. MD ordered LR 1000 ml bolus IV. This was given by RN   1019: BP 131/84 (99) HR 81, patient Alert and Oriented x 4, no complaints.

## 2024-01-29 NOTE — TOC CM/SW Note (Signed)
 Transition of Care [facility]) - Inpatient Brief Assessment   Patient Details  Name: Travis Weeks MRN: [id number] Date of Birth: 13-Jun-1965  Transition of [facility]) CM/SW Contact:    Lucie Lunger, LCSWA Phone Number: 01/29/2024, 8:43 AM   Clinical Narrative: Transition of Care Department [facility]) has reviewed patient and no TOC needs have been identified at this time. We will continue to monitor patient advancement through interdiciplinary progression rounds. If new patient transition needs arise, please place a TOC consult.  Transition of Care Asessment: Insurance and Status: Insurance coverage has been reviewed Patient has primary care physician: Yes Home environment has been reviewed: From home Prior level of function:: Independent Prior/Current Home Services: No current home services Social Drivers of Health Review: SDOH reviewed no interventions necessary Readmission risk has been reviewed: Yes Transition of care needs: no transition of care needs at this time

## 2024-01-29 NOTE — Plan of Care (Signed)

## 2024-01-30 DIAGNOSIS — E440 Moderate protein-calorie malnutrition: Secondary | ICD-10-CM | POA: Diagnosis not present

## 2024-01-30 DIAGNOSIS — E861 Hypovolemia: Secondary | ICD-10-CM | POA: Diagnosis not present

## 2024-01-30 DIAGNOSIS — E871 Hypo-osmolality and hyponatremia: Secondary | ICD-10-CM | POA: Diagnosis not present

## 2024-01-30 LAB — BASIC METABOLIC PANEL WITH GFR
Anion gap: 8 (ref 5–15)
BUN: <5 mg/dL — ABNORMAL LOW (ref 6–20)
CO2: 21 mmol/L — ABNORMAL LOW (ref 22–32)
Calcium: 8.2 mg/dL — ABNORMAL LOW (ref 8.9–10.3)
Chloride: 97 mmol/L — ABNORMAL LOW (ref 98–111)
Creatinine, Ser: 0.62 mg/dL (ref 0.61–1.24)
GFR, Estimated: >60 mL/min (ref >60)
Glucose, Bld: 83 mg/dL (ref 70–99)
Potassium: 4 mmol/L (ref 3.5–5.1)
Sodium: 126 mmol/L — ABNORMAL LOW (ref 135–145)

## 2024-01-30 LAB — ACTH STIMULATION, 3 TIME POINTS
Cortisol, 30 Min: 16 ug/dL
Cortisol, 60 Min: 20.2 ug/dL
Cortisol, Base: 4.3 ug/dL

## 2024-01-30 LAB — MAGNESIUM: Magnesium: 1.7 mg/dL (ref 1.7–2.4)

## 2024-01-30 MED ORDER — SODIUM CHLORIDE 0.9 % IV SOLN: INTRAVENOUS | Status: AC

## 2024-01-30 MED ORDER — MAGNESIUM SULFATE 2 GM/50ML IV SOLN
2.0000 g | Freq: Once | INTRAVENOUS | Status: AC
  Administered 2024-01-30: 2 g via INTRAVENOUS
  Filled 2024-01-30: qty 50

## 2024-01-30 NOTE — Plan of Care (Signed)

## 2024-01-30 NOTE — Progress Notes (Addendum)
 PROGRESS NOTE  Travis Weeks FMW:[id number] DOB: [birth year] DOA: 01/28/2024 PCP: Verdie Johann LABOR, DO  Brief History:  58 year old male with a history of alcohol dependence, hypertension, hyponatremia, COPD, tobacco abuse, GERD presenting from his endocrine physicians office due to soft blood pressures and hyponatremia.  The patient has had a history of chronic hyponatremia.  Apparently the patient had recent blood work obtained on 01/22/2024 which showed sodium 121.  He was noted to have an initial blood pressure of 90/54 with heart rate in the 56 in the endocrine office. The patient himself endorses generalized weakness for the past week.  He has had some occasional dizziness but denies any syncope or falls.  He denies any fevers, chills, chest pain, shortness breath, nausea, vomiting or diarrhea, abdominal pain.  He does complain that his abdomen feels bloated. He states that he is drinking at least 2-3 beers every other day.  He states that he previously drank four 20 ounce bottles of water, but he was told to cut back by his primary care provider to 2 bottles.  He is a difficult historian, but endorses compliance with his sodium chloride  tablets.  He states that he only eats 1 meal a day most days, but occasionally eats 2 meals a day.  He states he snacks throughout the day.  Continues to smoke 1 pack/day.  He uses THC intermittently.  He denies illicit drug use.  He denies any new medications, but seems to have poor insight regarding his medications. He recently visited the ED at [facility] with N/V and epigastric pain.  He had a CT of the abdomen and pelvis on 01/15/2024 at [facility] emergency department which showed persistent thickening and inflammation of the descending duodenum suggestive of duodenitis.  There is moderately large colonic stool volume.  There is sigmoid diverticulosis without inflammation.  Otherwise, no other acute findings.  He was discharged home with Zofran , PPI. In the  ED here, the patient was afebrile hemodynamically stable with oxygen saturation 100% room air.  WBC 9.4, hemoglobin 1.7, platelets 261.  Sodium 117, potassium 4.9, bicarbonate 19, serum creatinine 0.90.  LFTs unremarkable.  Alcohol level 28.  Chest x-ray showed hyperinflation.  Lactic acid 1.6.  EKG showed sinus rhythm with nonspecific T wave change.  There was first-degree AV block.  The patient was given 1 L NS and admitted for further evaluation and treatment.  Notably, the patient had blood work on 01/22/2024: TSH 0.230, free T40.97 Serum osmolarity 252 -Urine osmolarity 179 -Urine sodium 29 -A.m. cortisol 12.3   Assessment/Plan: Hypo-osmolar hyponatremia -repeat serum osm--251 -urine osm--106 -urine Na <30 -continue IV isotonic saline for now -continue NaCl tabs -multifactorial due to volume depletion/beer potomania/poor solute intake -AM cortisol 3.4 -cortrosyn  stimulation--normal -lipid panel LDL 40, TG 33, TC 107   Hypotension -UA--neg -blood cultures--neg to date -repeat lactate 1.4>>0.6 -afebrile- -rebolused fluid, continue maintenance IVF -personally reviewed CXR--no infiltrates -pt is asymptomatic -improved with fluid resuscitation   Alcohol dependence -Etoh level in ED = 28 -CIWA protocol -UDS--neg -no signs of withdrawal   Essential HTN hx -now soft  BPs/hypotension -holding metoprolol, amlodipine  and lisinopril  due to soft BPs   Hypothyroidism - Continue Synthroid  - 9/12 TSH 7.230, FT4 = 0.97  Moderate malnutrition -continue supplements   GERD/duodenitis -continue pantoprazole            Family Communication:  no  Family at bedside   Consultants:  none   Code Status:  FULL  DVT Prophylaxis:   [place] Lovenox      Procedures: As Listed in Progress Note Above   Antibiotics: None        Subjective: Patient denies fevers, chills, headache, chest pain, dyspnea, nausea, vomiting, diarrhea, abdominal pain, dysuria, hematuria,  hematochezia, and melena.   Objective: Vitals:   01/30/24 0536 01/30/24 1058 01/30/24 1215 01/30/24 1717  BP: 121/84 115/80 91/62 103/65  Pulse: 89 87 98 97  Resp: 18  20   Temp: 98.3 F (36.8 C)  98 F (36.7 C)   TempSrc: Oral  Oral   SpO2: 98%  96%   Weight:      Height:        Intake/Output Summary (Last 24 hours) at 01/30/2024 1751 Last data filed at 01/30/2024 1744 Gross per 24 hour  Intake 4319.37 ml  Output 3700 ml  Net 619.37 ml   Weight change:  Exam:  General:  Pt is alert, follows commands appropriately, not in acute distress HEENT: No icterus, No thrush, No neck mass, [place]/AT Cardiovascular: RRR, S1/S2, no rubs, no gallops Respiratory: CTA bilaterally, no wheezing, no crackles, no rhonchi Abdomen: Soft/+BS, non tender, non distended, no guarding Extremities: No edema, No lymphangitis, No petechiae, No rashes, no synovitis   Data Reviewed: I have personally reviewed following labs and imaging studies Basic Metabolic Panel: Recent Labs  Lab 01/28/24 1035 01/28/24 1623 01/28/24 2359 01/29/24 0333 01/29/24 0810 01/29/24 1241 01/30/24 0417  NA 117*   < > 125* 125* 126* 125* 126*  K 4.9  --   --  4.2  --   --  4.0  CL 86*  --   --  97*  --   --  97*  CO2 19*  --   --  21*  --   --  21*  GLUCOSE 72  --   --  85  --   --  83  BUN 8  --   --  9  --   --  <5*  CREATININE 0.90  --   --  0.79  --   --  0.62  CALCIUM 8.7*  --   --  8.3*  --   --  8.2*  MG 1.9  --   --  1.8  --   --  1.7   < > = values in this interval not displayed.   Liver Function Tests: Recent Labs  Lab 01/28/24 1035 01/29/24 0333  AST 30 24  ALT 14 13  ALKPHOS 92 84  BILITOT 0.6 1.0  PROT 6.8 5.8*  ALBUMIN 3.3* 2.8*   No results for input(s): LIPASE, AMYLASE in the last 168 hours. No results for input(s): AMMONIA in the last 168 hours. Coagulation Profile: No results for input(s): INR, PROTIME in the last 168 hours. CBC: Recent Labs  Lab 01/28/24 1035  01/29/24 0333  WBC 9.4 6.5  NEUTROABS 5.1  --   HGB 11.7* 10.6*  HCT 32.8* 30.1*  MCV 92.9 92.6  PLT 261 239   Cardiac Enzymes: No results for input(s): CKTOTAL, CKMB, CKMBINDEX, TROPONINI in the last 168 hours. BNP: Invalid input(s): POCBNP CBG: No results for input(s): GLUCAP in the last 168 hours. HbA1C: No results for input(s): HGBA1C in the last 72 hours. Urine analysis:    Component Value Date/Time   COLORURINE STRAW (A) 01/28/2024 1712   APPEARANCEUR CLEAR 01/28/2024 1712   LABSPEC 1.002 (L) 01/28/2024 1712   PHURINE 5.0 01/28/2024 1712   GLUCOSEU NEGATIVE 01/28/2024 1712   HGBUR  NEGATIVE 01/28/2024 1712   BILIRUBINUR NEGATIVE 01/28/2024 1712   KETONESUR NEGATIVE 01/28/2024 1712   PROTEINUR NEGATIVE 01/28/2024 1712   NITRITE NEGATIVE 01/28/2024 1712   LEUKOCYTESUR NEGATIVE 01/28/2024 1712   Sepsis Labs: @LABRCNTIP (procalcitonin:4,lacticidven:4) ) Recent Results (from the past 240 hours)  Culture, blood (Routine X 2) w Reflex to ID Panel     Status: None (Preliminary result)   Collection Time: 01/29/24  9:55 AM   Specimen: BLOOD  Result Value Ref Range Status   Specimen Description BLOOD BLOOD RIGHT HAND  Final   Special Requests   Final    BOTTLES DRAWN AEROBIC AND ANAEROBIC Blood Culture adequate volume   Culture   Final    NO GROWTH < 24 HOURS Performed at [facility], [address], [place], [place] [address]    Report Status PENDING  Incomplete  Culture, blood (Routine X 2) w Reflex to ID Panel     Status: None (Preliminary result)   Collection Time: 01/29/24  9:55 AM   Specimen: BLOOD  Result Value Ref Range Status   Specimen Description BLOOD BLOOD RIGHT ARM  Final   Special Requests   Final    BOTTLES DRAWN AEROBIC AND ANAEROBIC Blood Culture adequate volume   Culture   Final    NO GROWTH < 24 HOURS Performed at [facility], [address], [place], [place] [address]    Report Status PENDING  Incomplete     Scheduled Meds:   enoxaparin  (LOVENOX ) injection  40 mg Subcutaneous Q24H   folic acid   1 mg Oral Daily   levothyroxine   25 mcg Oral QAC breakfast   multivitamin with minerals  1 tablet Oral Daily   pantoprazole   40 mg Oral BID   QUEtiapine   100 mg Oral QHS   sodium chloride   2 g Oral BID WC   thiamine   100 mg Oral Daily   Or   thiamine   100 mg Intravenous Daily   Continuous Infusions:  Procedures/Studies: DG Chest 2 View Result Date: 01/28/2024 EXAM: 2 VIEW(S) XRAY OF THE CHEST 01/28/2024 11:23:00 AM COMPARISON: None available. CLINICAL HISTORY: Weakness. Per triage; Pt arrived via POV from PCP office for further treatment and evaluation of hyponatremia, hypotension, hyperkalemia and bradycardia. FINDINGS: LUNGS AND PLEURA: Hyperinflated lungs suggesting COPD. No focal pulmonary opacity. No pulmonary edema. No pleural effusion. No pneumothorax. HEART AND MEDIASTINUM: Ectatic and calcified aorta. No acute abnormality of the cardiac and mediastinal silhouettes. BONES AND SOFT TISSUES: Remote left rib fractures. Thoracic degenerative changes. No acute osseous abnormality. IMPRESSION: 1. No acute findings. 2. Hyperinflated lungs suggesting COPD. Electronically signed by: Waddell Calk MD 01/28/2024 12:01 PM EDT RP Workstation: [id number]    Alm Schneider, DO  [facility]  If 7PM-7AM, please contact night-coverage [email or web address] Password [email or web address] 01/30/2024, 5:51 PM   LOS: 2 days

## 2024-01-31 DIAGNOSIS — E861 Hypovolemia: Secondary | ICD-10-CM | POA: Diagnosis not present

## 2024-01-31 DIAGNOSIS — E440 Moderate protein-calorie malnutrition: Secondary | ICD-10-CM | POA: Diagnosis not present

## 2024-01-31 DIAGNOSIS — Z720 Tobacco use: Secondary | ICD-10-CM | POA: Diagnosis not present

## 2024-01-31 DIAGNOSIS — E871 Hypo-osmolality and hyponatremia: Secondary | ICD-10-CM | POA: Diagnosis not present

## 2024-01-31 LAB — BASIC METABOLIC PANEL WITH GFR
Anion gap: 6 (ref 5–15)
BUN: 7 mg/dL (ref 6–20)
CO2: 21 mmol/L — ABNORMAL LOW (ref 22–32)
Calcium: 7.8 mg/dL — ABNORMAL LOW (ref 8.9–10.3)
Chloride: 100 mmol/L (ref 98–111)
Creatinine, Ser: 0.72 mg/dL (ref 0.61–1.24)
GFR, Estimated: >60 mL/min (ref >60)
Glucose, Bld: 100 mg/dL — ABNORMAL HIGH (ref 70–99)
Potassium: 3.7 mmol/L (ref 3.5–5.1)
Sodium: 127 mmol/L — ABNORMAL LOW (ref 135–145)

## 2024-01-31 LAB — MAGNESIUM: Magnesium: 1.9 mg/dL (ref 1.7–2.4)

## 2024-01-31 MED ORDER — MAGNESIUM OXIDE -MG SUPPLEMENT 400 (240 MG) MG PO TABS
400.0000 mg | ORAL_TABLET | Freq: Every day | ORAL | Status: DC
  Administered 2024-01-31: 400 mg via ORAL
  Filled 2024-01-31: qty 1

## 2024-01-31 NOTE — Progress Notes (Addendum)
 1134: [facility] transport will Nurse, learning disability with ETA to take patient home for discharge.   1400: [facility] transport will arrive at approx 1700. [facility] RN charge aware.

## 2024-01-31 NOTE — Discharge Summary (Signed)
 Physician Discharge Summary   Patient: Travis Weeks MRN: [id number] DOB: November 19, 1965  Admit date:     01/28/2024  Discharge date: 01/31/24  Discharge Physician: Alm Garan Frappier   PCP: Verdie Scull A, DO   Recommendations at discharge:   Please follow up with primary care provider within 1-2 weeks  Please repeat BMP and CBC in one week     Hospital Course: 58 year old male with a history of alcohol dependence, hypertension, hyponatremia, COPD, tobacco abuse, GERD presenting from his endocrine physicians office due to soft blood pressures and hyponatremia.  The patient has had a history of chronic hyponatremia.  Apparently the patient had recent blood work obtained on 01/22/2024 which showed sodium 121.  He was noted to have an initial blood pressure of 90/54 with heart rate in the 56 in the endocrine office. The patient himself endorses generalized weakness for the past week.  He has had some occasional dizziness but denies any syncope or falls.  He denies any fevers, chills, chest pain, shortness breath, nausea, vomiting or diarrhea, abdominal pain.  He does complain that his abdomen feels bloated. He states that he is drinking at least 2-3 beers every other day.  He states that he previously drank four 20 ounce bottles of water, but he was told to cut back by his primary care provider to 2 bottles.  He is a difficult historian, but endorses compliance with his sodium chloride  tablets.  He states that he only eats 1 meal a day most days, but occasionally eats 2 meals a day.  He states he snacks throughout the day.  Continues to smoke 1 pack/day.  He uses THC intermittently.  He denies illicit drug use.  He denies any new medications, but seems to have poor insight regarding his medications. He recently visited the ED at [facility] with N/V and epigastric pain.  He had a CT of the abdomen and pelvis on 01/15/2024 at [facility] emergency department which showed persistent thickening and inflammation of the  descending duodenum suggestive of duodenitis.  There is moderately large colonic stool volume.  There is sigmoid diverticulosis without inflammation.  Otherwise, no other acute findings.  He was discharged home with Zofran , PPI. In the ED here, the patient was afebrile hemodynamically stable with oxygen saturation 100% room air.  WBC 9.4, hemoglobin 1.7, platelets 261.  Sodium 117, potassium 4.9, bicarbonate 19, serum creatinine 0.90.  LFTs unremarkable.  Alcohol level 28.  Chest x-ray showed hyperinflation.  Lactic acid 1.6.  EKG showed sinus rhythm with nonspecific T wave change.  There was first-degree AV block.  The patient was given 1 L NS and admitted for further evaluation and treatment.  Notably, the patient had blood work on 01/22/2024: TSH 0.230, free T40.97 Serum osmolarity 252 -Urine osmolarity 179 -Urine sodium 29 -A.m. cortisol 12.3  Assessment and Plan:  Hypo-osmolar hyponatremia -repeat serum osm--251 -urine osm--106 -urine Na <30 -continue IV isotonic saline for now -continue NaCl tabs>>Dr. Lenis had increased to 2 tabs bid on day pt presented to hospital -multifactorial due to volume depletion/beer potomania and poor solute intake -AM cortisol 3.4 -cortrosyn  stimulation--normal -lipid panel LDL 40, TG 33, TC 107 - Na  = 127 on day of dc - baseline Na 123-127 since 08/2016   Hypotension -UA--neg -blood cultures--neg to date -repeat lactate 1.4>>0.6 -afebrile- -rebolused fluid, continue maintenance IVF -personally reviewed CXR--no infiltrates -pt is asymptomatic -improved with fluid resuscitation -will not restart metoprolol, amlodipine , nor lisinopril    Alcohol dependence -Etoh level in ED =  28 -CIWA protocol -UDS--neg -no signs of withdrawal   Essential HTN hx -now soft  BPs/hypotension -holding metoprolol, amlodipine  and lisinopril  due to soft BPs -will not restart metoprolol, amlodipine , nor lisinopril    Hypothyroidism - Continue Synthroid  - 9/12  TSH 7.230, FT4 = 0.97   Moderate malnutrition -continue supplements   GERD/duodenitis -continue pantoprazole        Consultants: none Procedures performed: none  Disposition: Home Diet recommendation:  Regular diet DISCHARGE MEDICATION: Allergies as of 01/31/2024   No Known Allergies      Medication List     STOP taking these medications    amLODipine  5 MG tablet Commonly known as: NORVASC    lisinopril  20 MG tablet Commonly known as: ZESTRIL    metoprolol tartrate 25 MG tablet Commonly known as: LOPRESSOR       TAKE these medications    albuterol 108 (90 Base) MCG/ACT inhaler Commonly known as: VENTOLIN HFA Inhale 2 puffs into the lungs every 6 (six) hours as needed for wheezing or shortness of breath.   famotidine  20 MG tablet Commonly known as: PEPCID  Take 20 mg by mouth 2 (two) times daily.   folic acid  1 MG tablet Commonly known as: FOLVITE  Take 1 mg by mouth daily.   levothyroxine  25 MCG tablet Commonly known as: SYNTHROID  Take 1 tablet (25 mcg total) by mouth daily before breakfast.   magnesium  oxide 400 MG tablet Commonly known as: MAG-OX Take 500 mg by mouth daily.   omeprazole 20 MG capsule Commonly known as: PRILOSEC Take 20 mg by mouth 2 (two) times daily.   ondansetron  4 MG disintegrating tablet Commonly known as: ZOFRAN -ODT Take 4 mg by mouth every 8 (eight) hours as needed.   polyethylene glycol powder 17 GM/SCOOP powder Commonly known as: MiraLax  Mix one capful in 6 ounces of water twice daily until soft stool. Then continue one capful once to twice daily to maintain regular soft stools.   Pylera 140-125-125 MG Caps Generic drug: Bismuth/Metronidaz/Tetracyclin Take 3 capsules by mouth 4 (four) times daily.   QUEtiapine  100 MG tablet Commonly known as: SEROQUEL  Take 100 mg by mouth at bedtime.   sodium chloride  1 g tablet Take 2 tablets (2 g total) by mouth 2 (two) times daily with a meal. What changed: when to take this         Discharge Exam: Filed Weights   01/28/24 1005 01/29/24 0535  Weight: 64.3 kg 61.6 kg   HEENT:  [place]/AT, No thrush, no icterus CV:  RRR, no rub, no S3, no S4 Lung:  CTA, no wheeze, no rhonchi Abd:  soft/+BS, NT Ext:  No edema, no lymphangitis, no synovitis, no rash   Condition at discharge: stable  The results of significant diagnostics from this hospitalization (including imaging, microbiology, ancillary and laboratory) are listed below for reference.   Imaging Studies: DG Chest 2 View Result Date: 01/28/2024 EXAM: 2 VIEW(S) XRAY OF THE CHEST 01/28/2024 11:23:00 AM COMPARISON: None available. CLINICAL HISTORY: Weakness. Per triage; Pt arrived via POV from PCP office for further treatment and evaluation of hyponatremia, hypotension, hyperkalemia and bradycardia. FINDINGS: LUNGS AND PLEURA: Hyperinflated lungs suggesting COPD. No focal pulmonary opacity. No pulmonary edema. No pleural effusion. No pneumothorax. HEART AND MEDIASTINUM: Ectatic and calcified aorta. No acute abnormality of the cardiac and mediastinal silhouettes. BONES AND SOFT TISSUES: Remote left rib fractures. Thoracic degenerative changes. No acute osseous abnormality. IMPRESSION: 1. No acute findings. 2. Hyperinflated lungs suggesting COPD. Electronically signed by: Waddell Calk MD 01/28/2024 12:01 PM EDT RP  Workstation: [id number]    Microbiology: Results for orders placed or performed during the hospital encounter of 01/28/24  Culture, blood (Routine X 2) w Reflex to ID Panel     Status: None (Preliminary result)   Collection Time: 01/29/24  9:55 AM   Specimen: BLOOD  Result Value Ref Range Status   Specimen Description BLOOD BLOOD RIGHT HAND  Final   Special Requests   Final    BOTTLES DRAWN AEROBIC AND ANAEROBIC Blood Culture adequate volume   Culture   Final    NO GROWTH 2 DAYS Performed at [facility], [address] [place], [place] [address]    Report Status PENDING  Incomplete  Culture, blood  (Routine X 2) w Reflex to ID Panel     Status: None (Preliminary result)   Collection Time: 01/29/24  9:55 AM   Specimen: BLOOD  Result Value Ref Range Status   Specimen Description BLOOD BLOOD RIGHT ARM  Final   Special Requests   Final    BOTTLES DRAWN AEROBIC AND ANAEROBIC Blood Culture adequate volume   Culture   Final    NO GROWTH 2 DAYS Performed at [facility], [address], [place], [place] [address]    Report Status PENDING  Incomplete    Labs: CBC: Recent Labs  Lab 01/28/24 1035 01/29/24 0333  WBC 9.4 6.5  NEUTROABS 5.1  --   HGB 11.7* 10.6*  HCT 32.8* 30.1*  MCV 92.9 92.6  PLT 261 239   Basic Metabolic Panel: Recent Labs  Lab 01/28/24 1035 01/28/24 1623 01/29/24 0333 01/29/24 0810 01/29/24 1241 01/30/24 0417 01/31/24 0411  NA 117*   < > 125* 126* 125* 126* 127*  K 4.9  --  4.2  --   --  4.0 3.7  CL 86*  --  97*  --   --  97* 100  CO2 19*  --  21*  --   --  21* 21*  GLUCOSE 72  --  85  --   --  83 100*  BUN 8  --  9  --   --  <5* 7  CREATININE 0.90  --  0.79  --   --  0.62 0.72  CALCIUM 8.7*  --  8.3*  --   --  8.2* 7.8*  MG 1.9  --  1.8  --   --  1.7 1.9   < > = values in this interval not displayed.   Liver Function Tests: Recent Labs  Lab 01/28/24 1035 01/29/24 0333  AST 30 24  ALT 14 13  ALKPHOS 92 84  BILITOT 0.6 1.0  PROT 6.8 5.8*  ALBUMIN 3.3* 2.8*   CBG: No results for input(s): GLUCAP in the last 168 hours.  Discharge time spent: greater than 30 minutes.  Signed: Alm Schneider, MD [facility] 01/31/2024

## 2024-02-03 LAB — CULTURE, BLOOD (ROUTINE X 2)
Culture: NO GROWTH
Culture: NO GROWTH
Special Requests: ADEQUATE
Special Requests: ADEQUATE

## 2024-02-04 DIAGNOSIS — E871 Hypo-osmolality and hyponatremia: Secondary | ICD-10-CM | POA: Diagnosis not present

## 2024-02-05 LAB — COMPREHENSIVE METABOLIC PANEL WITH GFR
ALT: 10 IU/L (ref 0–44)
AST: 25 IU/L (ref 0–40)
Albumin: 3.6 g/dL — ABNORMAL LOW (ref 3.8–4.9)
Alkaline Phosphatase: 108 IU/L (ref 47–123)
BUN/Creatinine Ratio: 9 (ref 9–20)
BUN: 7 mg/dL (ref 6–24)
Bilirubin Total: 0.3 mg/dL (ref 0.0–1.2)
CO2: 18 mmol/L — ABNORMAL LOW (ref 20–29)
Calcium: 8.8 mg/dL (ref 8.7–10.2)
Chloride: 92 mmol/L — ABNORMAL LOW (ref 96–106)
Creatinine, Ser: 0.74 mg/dL — ABNORMAL LOW (ref 0.76–1.27)
Globulin, Total: 2.8 g/dL (ref 1.5–4.5)
Glucose: 69 mg/dL — ABNORMAL LOW (ref 70–99)
Potassium: 5 mmol/L (ref 3.5–5.2)
Sodium: 126 mmol/L — ABNORMAL LOW (ref 134–144)
Total Protein: 6.4 g/dL (ref 6.0–8.5)
eGFR: 104 mL/min/1.73 (ref >59)

## 2024-02-05 LAB — TSH: TSH: 3.8 u[IU]/mL (ref 0.450–4.500)

## 2024-02-05 LAB — T4, FREE: Free T4: 1.4 ng/dL (ref 0.82–1.77)

## 2024-02-16 ENCOUNTER — Other Ambulatory Visit: Payer: Self-pay | Admitting: "Endocrinology

## 2024-02-17 ENCOUNTER — Encounter: Payer: Self-pay | Admitting: "Endocrinology

## 2024-02-17 ENCOUNTER — Ambulatory Visit: Admitting: "Endocrinology

## 2024-02-17 VITALS — BP 134/82 | HR 84 | Ht 70.0 in | Wt 147.2 lb

## 2024-02-17 DIAGNOSIS — F17200 Nicotine dependence, unspecified, uncomplicated: Secondary | ICD-10-CM | POA: Diagnosis not present

## 2024-02-17 DIAGNOSIS — E039 Hypothyroidism, unspecified: Secondary | ICD-10-CM

## 2024-02-17 DIAGNOSIS — E871 Hypo-osmolality and hyponatremia: Secondary | ICD-10-CM

## 2024-02-17 DIAGNOSIS — E875 Hyperkalemia: Secondary | ICD-10-CM

## 2024-02-17 DIAGNOSIS — F1010 Alcohol abuse, uncomplicated: Secondary | ICD-10-CM

## 2024-02-17 DIAGNOSIS — E440 Moderate protein-calorie malnutrition: Secondary | ICD-10-CM

## 2024-02-17 NOTE — Progress Notes (Signed)
 02/17/2024, 12:26 PM   Endocrinology follow-up note  Subjective:    Patient ID: Travis Weeks, male    DOB: 09/04/1965, PCP Verdie Johann LABOR, DO   Past Medical History:  Diagnosis Date   Anal fistula, complex, unspecified    in setting of significant perineal infection   COPD (chronic obstructive pulmonary disease) (HCC)    ETOH abuse    GERD (gastroesophageal reflux disease)    HTN (hypertension)    Stomach ulcer    Past Surgical History:  Procedure Laterality Date   BIOPSY  01/29/2017   Procedure: BIOPSY;  Surgeon: Shaaron Lamar HERO, MD;  Location: [facility];  Service: Endoscopy;;  gastric   COLONOSCOPY WITH PROPOFOL  N/A 01/29/2017   Rourk: diverticulosis. next colonoscopy in 10 years   ESOPHAGOGASTRODUODENOSCOPY (EGD) WITH PROPOFOL  N/A 01/29/2017   Rourk: erosive reflux esophagitis, gastric erosions (benign bx and no h.pylori), duodenal erosions   FINGER SURGERY Left    middle finger of L hand. pt unable to move it   Social History   Socioeconomic History   Marital status: Single    Spouse name: Not on file   Number of children: Not on file   Years of education: Not on file   Highest education level: Not on file  Occupational History   Occupation: Advice worker  Tobacco Use   Smoking status: Every Day    Current packs/day: 1.00    Average packs/day: 1 pack/day for 49.8 years (49.8 ttl pk-yrs)    Types: Cigarettes    Start date: 1976    Passive exposure: Current   Smokeless tobacco: Never  Vaping Use   Vaping status: Never Used  Substance and Sexual Activity   Alcohol use: Yes    Alcohol/week: 2.0 - 3.0 standard drinks of alcohol    Types: 2 - 3 Cans of beer per week    Comment: several days per week (01/06/17). as of 10/26/23, has a beer when watching a game, not all the time   Drug use: Not Currently    Comment: UDS positive for cocaine 08/2016. No drugs in several months (as of 12/2016).   Sexual  activity: Not Currently  Other Topics Concern   Not on file  Social History Narrative   Not on file   Social Drivers of Health   Financial Resource Strain: Low Risk  (05/29/2023)   Received from [facility]   Overall Financial Resource Strain (CARDIA)    Difficulty of Paying Living Expenses: Not hard at all  Food Insecurity: No Food Insecurity (01/29/2024)   Hunger Vital Sign    Worried About Running Out of Food in the Last Year: Never true    Ran Out of Food in the Last Year: Never true  Transportation Needs: No Transportation Needs (01/29/2024)   PRAPARE - Administrator, Civil Service (Medical): No    Lack of Transportation (Non-Medical): No  Physical Activity: Insufficiently Active (02/18/2023)   Received from [facility]   Exercise Vital Sign    On average, how many days per week do you engage in moderate to strenuous exercise (like a brisk walk)?: 2 days    On average, how many minutes do you engage  in exercise at this level?: 10 min  Stress: No Stress Concern Present (06/08/2023)   Received from [facility] - Occupational Stress Questionnaire    Feeling of Stress : Not at all  Social Connections: Moderately Isolated (06/08/2023)   Received from [facility]   Social Connection and Isolation Panel    In a typical week, how many times do you talk on the phone with family, friends, or neighbors?: More than three times a week    How often do you get together with friends or relatives?: Never    How often do you attend church or religious services?: Never    Do you belong to any clubs or organizations such as church groups, unions, fraternal or athletic groups, or school groups?: Yes    How often do you attend meetings of the clubs or organizations you belong to?: Never    Are you married, widowed, divorced, separated, never married, or living with a partner?: Never married   Family History  Problem Relation Age  of Onset   Kidney disease Mother    Hypertension Mother    Diabetes Mother    Heart attack Mother    Colon cancer Neg Hx    Outpatient Encounter Medications as of 02/17/2024  Medication Sig   albuterol (VENTOLIN HFA) 108 (90 Base) MCG/ACT inhaler Inhale 2 puffs into the lungs every 6 (six) hours as needed for wheezing or shortness of breath.   famotidine  (PEPCID ) 20 MG tablet Take 20 mg by mouth 2 (two) times daily.   folic acid  (FOLVITE ) 1 MG tablet Take 1 mg by mouth daily.   levothyroxine  (SYNTHROID ) 25 MCG tablet Take 1 tablet (25 mcg total) by mouth daily before breakfast.   magnesium  oxide (MAG-OX) 400 MG tablet Take 500 mg by mouth daily.   omeprazole (PRILOSEC) 20 MG capsule Take 20 mg by mouth 2 (two) times daily.   ondansetron  (ZOFRAN -ODT) 4 MG disintegrating tablet Take 4 mg by mouth every 8 (eight) hours as needed. (Patient not taking: Reported on 01/28/2024)   polyethylene glycol powder (MIRALAX ) 17 GM/SCOOP powder Mix one capful in 6 ounces of water twice daily until soft stool. Then continue one capful once to twice daily to maintain regular soft stools.   PYLERA 140-125-125 MG CAPS Take 3 capsules by mouth 4 (four) times daily.   QUEtiapine  (SEROQUEL ) 100 MG tablet Take 100 mg by mouth at bedtime.   sodium chloride  1 g tablet Take 2 tablets (2 g total) by mouth 2 (two) times daily with a meal.   No facility-administered encounter medications on file as of 02/17/2024.   ALLERGIES: No Known Allergies  VACCINATION STATUS:  There is no immunization history on file for this patient.  HPI Travis Weeks is 58 y.o. male who presents today with a medical history as above. he is being seen in follow-up after he was seen in consultation for hyponatremia. Patient is known to have chronic hyponatremia, hyperosmolality, protein calorie malnutrition, alcohol abuse.   Due to severe hyponatremia sodium at 121 during his last visit he was sent to ER for better evaluation.  He was  found to have sodium of 117, admitted and kept in the hospital until September 21 when he was discharged with sodium of 127.  4 days later later he sodium was at 126.  He returns with significant symptomatic improvement today communicating better than last visit.  His blood pressure is supported at target.  He started his thyroid  hormone prescribed during previous encounter.  He denies any headaches, nausea, vomiting.   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  - He is a suboptimal historian with possible cognitive deficit.  Patient with significant medical history of alcoholism, GERD, hypertension, COPD-continues to smoke heavily. Review of his medical records show hyponatremia at least from 2018.  He also had intermittent hypokalemia although most recently his potassium has been stabilizing.  Patient denies history of CHF, not sure if he has cirrhosis.  He does have chronic hypoalbuminemia.  His sodium ranged between 117 and 126 for the last 4 years. Admittedly, he does not eat enough protein, continues to drink alcohol. His other medical problem includes hypertension currently not on treatment.  He is also on antidepressant Seroquel  and MiraLAX . He is not on a diuretic.  Review of Systems  Constitutional: +mildly fluctuating body weight -presents with 12 pounds of weight gain since last visit,  no fatigue, no subjective hyperthermia, no subjective hypothermia  Skin: no rashes Neurological: no tremors, no numbness, no tingling, no dizziness Psychiatric: + depression, no anxiety  Objective:       02/17/2024   10:17 AM 01/31/2024   10:05 AM 01/31/2024    5:00 AM  Vitals with BMI  Height 5' 10    Weight 147 lbs 3 oz    BMI 21.12    Systolic 134 118 877  Diastolic 82 60 60  Pulse 84  74    BP 134/82   Pulse 84   Ht 5' 10 (1.778 m)   Wt 147 lb 3.2 oz (66.8 kg)   BMI 21.12 kg/m   Wt Readings from Last  3 Encounters:  02/17/24 147 lb 3.2 oz (66.8 kg)  01/29/24 135 lb 12.9 oz (61.6 kg)  01/28/24 141 lb 12.8 oz (64.3 kg)    Physical Exam  Constitutional:  Body mass index is 21.12 kg/m.,  not in acute distress,  + disheveled Eyes: PERRLA, EOMI, no exophthalmos  Cardiovascular: +Hypotensive, bradycardic  Respiratory:  + Diffuse wheezes on bilateral lung fields   Musculoskeletal: no gross deformities, strength intact in all four extremities, no peripheral edema Skin: Low manage warm, no rashes Neurological: no tremor with outstretched hands, Deep tendon reflexes normal in bilateral lower extremities.  CMP ( most recent) CMP     Component Value Date/Time   NA 126 (L) 02/04/2024 0821   K 5.0 02/04/2024 0821   CL 92 (L) 02/04/2024 0821   CO2 18 (L) 02/04/2024 0821   GLUCOSE 69 (L) 02/04/2024 0821   GLUCOSE 100 (H) 01/31/2024 0411   BUN 7 02/04/2024 0821   CREATININE 0.74 (L) 02/04/2024 0821   CREATININE 0.67 (L) 09/03/2016 1531   CALCIUM 8.8 02/04/2024 0821   PROT 6.4 02/04/2024 0821   ALBUMIN 3.6 (L) 02/04/2024 0821   AST 25 02/04/2024 0821   ALT 10 02/04/2024 0821   ALKPHOS 108 02/04/2024 0821   BILITOT 0.3 02/04/2024 0821   EGFR 104 02/04/2024 0821   GFRNONAA >60 01/31/2024 0411     Diabetic Labs (most recent): Lab Results  Component Value Date   HGBA1C 4.6 (L) 02/15/2020   HGBA1C 4.2 09/03/2016     Lipid Panel ( most recent) Lipid Panel     Component Value Date/Time   CHOL 107 01/29/2024 0333   TRIG 33 01/29/2024 0333   HDL 60 01/29/2024 0333   CHOLHDL 1.8 01/29/2024 0333   VLDL 7  01/29/2024 0333   LDLCALC 40 01/29/2024 0333      Lab Results  Component Value Date   TSH 3.800 02/04/2024   TSH 7.230 (H) 01/22/2024   TSH 1.795 09/04/2016   TSH 1.76 09/03/2016   FREET4 1.40 02/04/2024   FREET4 0.97 01/22/2024      Assessment & Plan:   1.  Hypovolemia, hypo-osmolality and hyponatremia 2.  Protein calorie malnutrition  3.  Alcohol abuse 4.   Hyperkalemia 5.  Hypothyroidism   - I have reviewed his interval development, hospitalization and discharge plans, new and available  records and clinically evaluated the patient. - Based on these reviews, he has chronic hypoosmolar hyponatremia, likely related to [facility] Potomania versus SIADH, or a combination of these 2.   He has responded to inpatient treatment with IV saline, was discharged with sodium chloride  tablets with his sodium stabilizing at around his baseline.  Patient is clinically stable today.  I advised him to continue sodium chloride  2 g twice daily and repeat his labs in 1-2 weeks.  He is advised to continue his Synthroid  25 mcg p.o. daily before breakfast.   - If his sodium started to drop, he will be considered for urea 15 g - 30 g daily to encourage aquaresis without solute loss.    He prevailing labs are consistent with hyperosmolality with inappropriately normal urine osmolality supporting a diagnosis of SIADH.  - Patient has background of chronic protein calorie malnutrition, encouraged to eat more protein, starch, fruits and vegetables, sandwiches.  It is likely that patient has protein calorie malnutrition as evidenced by hypoalbuminemia.   He has chronic heavy smoking, may need screening for occult malignancy.  His recent chest x-ray consistent with hyperinflated lungs suggestive of COPD without any acute changes.  He has 50+ pack years history of smoking.  The general approach for SIADH would be free water restrictions as much as possible.   - He is advised to continue levothyroxine  25 mg p.o. daily before breakfast.  -I have advised patient to cut his free water by half, while maintaining  his other liquids including milk, electrolyte drinks, juice for more solute intake.  -Patient is at risk of liver disease, cardiomyopathy, CKD, malignancies, etc.    The patient was counseled on the dangers of tobacco use, and was advised to quit.  Reviewed strategies to maximize  success, including removing cigarettes and smoking materials from environment.  - he is advised to maintain close follow up with Verdie Scull A, DO for primary care needs.   I spent  26  minutes in the care of the patient today including review of labs from Thyroid  Function, CMP, and other relevant labs ; imaging/biopsy records (current and previous including abstractions from other facilities); face-to-face time discussing  his lab results and symptoms, medications doses, his options of short and long term treatment based on the latest standards of care / guidelines;   and documenting the encounter.  Travis Weeks  participated in the discussions, expressed understanding, and voiced agreement with the above plans.  All questions were answered to his satisfaction. he is encouraged to contact clinic should he have any questions or concerns prior to his return visit.   Follow up plan: Return in about 4 weeks (around 03/16/2024) for F/U with Pre-visit Labs.   Ranny Earl, MD [address] [facility] [address] [place], [place] [address] Phone: [phone]  Fax: [phone]     02/17/2024, 12:26 PM  This note  was partially dictated with voice recognition software. Similar sounding words can be transcribed inadequately or may not  be corrected upon review.

## 2024-03-18 ENCOUNTER — Ambulatory Visit: Admitting: "Endocrinology

## 2024-03-25 DIAGNOSIS — E871 Hypo-osmolality and hyponatremia: Secondary | ICD-10-CM | POA: Diagnosis not present

## 2024-03-25 DIAGNOSIS — E039 Hypothyroidism, unspecified: Secondary | ICD-10-CM | POA: Diagnosis not present

## 2024-03-28 LAB — COMPREHENSIVE METABOLIC PANEL WITH GFR
ALT: 21 IU/L (ref 0–44)
AST: 53 IU/L — ABNORMAL HIGH (ref 0–40)
Albumin: 3.8 g/dL (ref 3.8–4.9)
Alkaline Phosphatase: 110 IU/L (ref 47–123)
BUN/Creatinine Ratio: 7 — ABNORMAL LOW (ref 9–20)
BUN: 5 mg/dL — ABNORMAL LOW (ref 6–24)
Bilirubin Total: 0.3 mg/dL (ref 0.0–1.2)
CO2: 20 mmol/L (ref 20–29)
Calcium: 8.8 mg/dL (ref 8.7–10.2)
Chloride: 91 mmol/L — ABNORMAL LOW (ref 96–106)
Creatinine, Ser: 0.7 mg/dL — ABNORMAL LOW (ref 0.76–1.27)
Globulin, Total: 2.9 g/dL (ref 1.5–4.5)
Glucose: 75 mg/dL (ref 70–99)
Potassium: 4.4 mmol/L (ref 3.5–5.2)
Sodium: 126 mmol/L — ABNORMAL LOW (ref 134–144)
Total Protein: 6.7 g/dL (ref 6.0–8.5)
eGFR: 106 mL/min/1.73 (ref >59)

## 2024-03-28 LAB — CORTISOL-AM, BLOOD: Cortisol - AM: 7.9 ug/dL (ref 6.2–19.4)

## 2024-03-28 LAB — TSH: TSH: 1.86 u[IU]/mL (ref 0.450–4.500)

## 2024-03-28 LAB — T4, FREE: Free T4: 1.37 ng/dL (ref 0.82–1.77)

## 2024-03-31 ENCOUNTER — Ambulatory Visit: Admitting: "Endocrinology

## 2024-04-05 ENCOUNTER — Ambulatory Visit: Admitting: "Endocrinology

## 2024-04-05 ENCOUNTER — Telehealth: Payer: Self-pay

## 2024-04-05 ENCOUNTER — Encounter: Payer: Self-pay | Admitting: "Endocrinology

## 2024-04-05 VITALS — BP 108/72 | HR 88 | Ht 70.0 in | Wt 150.2 lb

## 2024-04-05 DIAGNOSIS — E440 Moderate protein-calorie malnutrition: Secondary | ICD-10-CM | POA: Diagnosis not present

## 2024-04-05 DIAGNOSIS — E039 Hypothyroidism, unspecified: Secondary | ICD-10-CM | POA: Diagnosis not present

## 2024-04-05 DIAGNOSIS — F17200 Nicotine dependence, unspecified, uncomplicated: Secondary | ICD-10-CM

## 2024-04-05 DIAGNOSIS — E871 Hypo-osmolality and hyponatremia: Secondary | ICD-10-CM | POA: Diagnosis not present

## 2024-04-05 MED ORDER — SODIUM CHLORIDE 1 G PO TABS: 2.0000 g | ORAL_TABLET | Freq: Two times a day (BID) | ORAL | 3 refills | Status: AC

## 2024-04-05 NOTE — Telephone Encounter (Signed)
 Orders for ACTH  3 point time test faxed to [facility] at [facility].

## 2024-04-05 NOTE — Progress Notes (Signed)
 04/05/2024, 2:08 PM   Endocrinology follow-up note  Subjective:    Patient ID: Travis Weeks, male    DOB: Dec 21, 1965, PCP Verdie Johann LABOR, DO   Past Medical History:  Diagnosis Date   Anal fistula, complex, unspecified    in setting of significant perineal infection   COPD (chronic obstructive pulmonary disease) (HCC)    ETOH abuse    GERD (gastroesophageal reflux disease)    HTN (hypertension)    Stomach ulcer    Past Surgical History:  Procedure Laterality Date   BIOPSY  01/29/2017   Procedure: BIOPSY;  Surgeon: Shaaron Lamar HERO, MD;  [facility]: [facility];  Service: Endoscopy;;  gastric   COLONOSCOPY WITH PROPOFOL  N/A 01/29/2017   Rourk: diverticulosis. next colonoscopy in 10 years   ESOPHAGOGASTRODUODENOSCOPY (EGD) WITH PROPOFOL  N/A 01/29/2017   Rourk: erosive reflux esophagitis, gastric erosions (benign bx and no h.pylori), duodenal erosions   FINGER SURGERY Left    middle finger of L hand. pt unable to move it   Social History   Socioeconomic History   Marital status: Single    Spouse name: Not on file   Number of children: Not on file   Years of education: Not on file   Highest education level: Not on file  Occupational History   Occupation: advice worker  Tobacco Use   Smoking status: Every Day    Current packs/day: 1.00    Average packs/day: 1 pack/day for 49.9 years (49.9 ttl pk-yrs)    Types: Cigarettes    Start date: 1976    Passive exposure: Current   Smokeless tobacco: Never  Vaping Use   Vaping status: Never Used  Substance and Sexual Activity   Alcohol use: Yes    Alcohol/week: 2.0 - 3.0 standard drinks of alcohol    Types: 2 - 3 Cans of beer per week    Comment: several days per week (01/06/17). as of 10/26/23, has a beer when watching a game, not all the time   Drug use: Not Currently    Comment: UDS positive for cocaine 08/2016. No drugs in several months (as of 12/2016).   Sexual  activity: Not Currently  Other Topics Concern   Not on file  Social History Narrative   Not on file   Social Drivers of Health   Financial Resource Strain: Low Risk (05/29/2023)   Received from [facility]   Overall Financial Resource Strain (CARDIA)    Difficulty of Paying Living Expenses: Not hard at all  Food Insecurity: No Food Insecurity (01/29/2024)   Hunger Vital Sign    Worried About Running Out of Food in the Last Year: Never true    Ran Out of Food in the Last Year: Never true  Transportation Needs: No Transportation Needs (01/29/2024)   PRAPARE - Administrator, Civil Service (Medical): No    Lack of Transportation (Non-Medical): No  Physical Activity: Insufficiently Active (02/18/2023)   [facility] from [facility]   Exercise Vital Sign    On average, how many days per week do you engage in moderate to strenuous exercise (like a brisk walk)?: 2 days    On average, how many minutes do you engage in  exercise at this level?: 10 min  Stress: No Stress Concern Present (06/08/2023)   Received from [facility] - Occupational Stress Questionnaire    Feeling of Stress : Not at all  Social Connections: Moderately Isolated (06/08/2023)   Received from [facility]   Social Connection and Isolation Panel    In a typical week, how many times do you talk on the phone with family, friends, or neighbors?: More than three times a week    How often do you get together with friends or relatives?: Never    How often do you attend church or religious services?: Never    Do you belong to any clubs or organizations such as church groups, unions, fraternal or athletic groups, or school groups?: Yes    How often do you attend meetings of the clubs or organizations you belong to?: Never    Are you married, widowed, divorced, separated, never married, or living with a partner?: Never married   Family History  Problem Relation Age  of Onset   Kidney disease Mother    Hypertension Mother    Diabetes Mother    Heart attack Mother    Colon cancer Neg Hx    Outpatient Encounter Medications as of 04/05/2024  Medication Sig   albuterol (VENTOLIN HFA) 108 (90 Base) MCG/ACT inhaler Inhale 2 puffs into the lungs every 6 (six) hours as needed for wheezing or shortness of breath.   famotidine  (PEPCID ) 20 MG tablet Take 20 mg by mouth 2 (two) times daily.   folic acid  (FOLVITE ) 1 MG tablet Take 1 mg by mouth daily.   levothyroxine  (SYNTHROID ) 25 MCG tablet Take 1 tablet (25 mcg total) by mouth daily before breakfast.   magnesium  oxide (MAG-OX) 400 MG tablet Take 500 mg by mouth daily.   omeprazole (PRILOSEC) 20 MG capsule Take 20 mg by mouth 2 (two) times daily.   ondansetron  (ZOFRAN -ODT) 4 MG disintegrating tablet Take 4 mg by mouth every 8 (eight) hours as needed. (Patient not taking: Reported on 01/28/2024)   polyethylene glycol powder (MIRALAX ) 17 GM/SCOOP powder Mix one capful in 6 ounces of water twice daily until soft stool. Then continue one capful once to twice daily to maintain regular soft stools.   PYLERA 140-125-125 MG CAPS Take 3 capsules by mouth 4 (four) times daily.   QUEtiapine  (SEROQUEL ) 100 MG tablet Take 100 mg by mouth at bedtime.   sodium chloride  1 g tablet Take 2 tablets (2 g total) by mouth 2 (two) times daily with a meal.   [DISCONTINUED] sodium chloride  1 g tablet Take 2 tablets (2 g total) by mouth 2 (two) times daily with a meal.   No facility-administered encounter medications on file as of 04/05/2024.   ALLERGIES: No Known Allergies  VACCINATION STATUS:  There is no immunization history on file for this patient.  HPI Travis Weeks is 58 y.o. male who presents today with a medical history as above. he is being seen in follow-up after he was seen in consultation for hyponatremia. Patient is known to have chronic hyponatremia, hyposmolality, protein calorie malnutrition, alcohol abuse.    In September 2025, he was sent to ER when he was found to have sodium level of 117 which responded to saline infusion.  Subsequently, he stabilized his sodium at 126.  He remains asymptomatic.  He is on sodium chloride  tablets.  He was also diagnosed with hypothyroidism for which he is on levothyroxine .  He reports better energy, returns with some appropriate weight gain.   He denies any headaches, nausea, vomiting.   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  - He is a suboptimal historian with possible cognitive deficit.  Patient with significant medical history of alcoholism, GERD, hypertension, COPD-continues to smoke heavily. Review of his medical records show hyponatremia at least from 2018.  He also had intermittent hypokalemia although most recently his potassium has been stabilizing.  Patient denies history of CHF, not sure if he has cirrhosis.  He does have chronic hypoalbuminemia.  His sodium ranged between 117 and 126 for the last 4 years. Admittedly, he does not eat enough protein, continues to drink alcohol.  He is also on antidepressant Seroquel  and MiraLAX . He is not on a diuretic.  Review of Systems  Constitutional: + Progressively and appropriately regaining his weight, gained 15 pounds since September 2025.    Skin: no rashes Neurological: no tremors, no numbness, no tingling, no dizziness Psychiatric: + depression, no anxiety  Objective:       04/05/2024    9:13 AM 02/17/2024   10:17 AM 01/31/2024   10:05 AM  Vitals with BMI  Height 5' 10 5' 10   Weight 150 lbs 3 oz 147 lbs 3 oz   BMI 21.55 21.12   Systolic 108 134 881  Diastolic 72 82 60  Pulse 88 84     BP 108/72   Pulse 88   Ht 5' 10 (1.778 m)   Wt 150 lb 3.2 oz (68.1 kg)   BMI 21.55 kg/m   Wt Readings from Last 3 Encounters:  04/05/24 150 lb 3.2 oz (68.1 kg)  02/17/24 147 lb 3.2 oz (66.8 kg)  01/29/24 135 lb 12.9 oz  (61.6 kg)    Physical Exam  Constitutional:  Body mass index is 21.55 kg/m.,  not in acute distress,  + disheveled Eyes: PERRLA, EOMI, no exophthalmos  CMP ( most recent) CMP     Component Value Date/Time   NA 126 (L) 03/25/2024 0804   K 4.4 03/25/2024 0804   CL 91 (L) 03/25/2024 0804   CO2 20 03/25/2024 0804   GLUCOSE 75 03/25/2024 0804   GLUCOSE 100 (H) 01/31/2024 0411   BUN 5 (L) 03/25/2024 0804   CREATININE 0.70 (L) 03/25/2024 0804   CREATININE 0.67 (L) 09/03/2016 1531   CALCIUM 8.8 03/25/2024 0804   PROT 6.7 03/25/2024 0804   ALBUMIN 3.8 03/25/2024 0804   AST 53 (H) 03/25/2024 0804   ALT 21 03/25/2024 0804   ALKPHOS 110 03/25/2024 0804   BILITOT 0.3 03/25/2024 0804   EGFR 106 03/25/2024 0804   GFRNONAA >60 01/31/2024 0411     Diabetic Labs (most recent): Lab Results  Component Value Date   HGBA1C 4.6 (L) 02/15/2020   HGBA1C 4.2 09/03/2016     Lipid Panel ( most recent) Lipid Panel     Component Value Date/Time   CHOL 107 01/29/2024 0333   TRIG 33 01/29/2024 0333   HDL 60 01/29/2024 0333   CHOLHDL 1.8 01/29/2024 0333   VLDL 7 01/29/2024 0333   LDLCALC 40 01/29/2024 0333      Lab Results  Component Value Date   TSH 1.860 03/25/2024   TSH 3.800 02/04/2024   TSH 7.230 (H) 01/22/2024   TSH 1.795 09/04/2016   TSH 1.76 09/03/2016   FREET4 1.37 03/25/2024   FREET4 1.40 02/04/2024   FREET4 0.97 01/22/2024  Assessment & Plan:   1.  Hypovolemia, hypo-osmolality and hyponatremia 2.  Protein calorie malnutrition  3.  Alcohol abuse 4.  Hyperkalemia-resolved 5.  Hypothyroidism   - I have reviewed his interval development, hospitalization and discharge plans, new and available  records and clinically evaluated the patient. - Based on these reviews, he has chronic hypoosmolar hyponatremia, likely related to [facility] Potomania versus SIADH, or a combination of these 2.   He has responded to inpatient treatment with IV saline, was discharged with sodium  chloride tablets with his sodium stabilizing at around his baseline.  Patient is clinically stable today.  I advised him to continue sodium chloride  2 g twice daily and repeat his labs in 8 weeks.  He hesitates to receive frequent labs and office visits, however this is necessary for him to avoid unnecessary hospitalizations.   He is advised to continue his Synthroid  25 mcg p.o. daily before breakfast.    - We discussed about the correct intake of his thyroid  hormone, on empty stomach at fasting, with water, separated by at least 30 minutes from breakfast and other medications,  and separated by more than 4 hours from calcium, iron, multivitamins, acid reflux medications (PPIs). -Patient is made aware of the fact that thyroid  hormone replacement is needed for life, dose to be adjusted by periodic monitoring of thyroid  function tests.  His labs show suboptimal cortisol of 3.4-7.9 at 8 AM, this patient will benefit from further adrenal assessment with ACTH  stimulation test.  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 The general approach for SIADH would be free water restrictions as much as possible.  -I have advised patient to cut his free water by half, while maintaining  his other liquids including milk, electrolyte drinks, juice for more solute intake.  - Patient has background of chronic protein calorie malnutrition, encouraged to eat more protein, starch, fruits and vegetables, sandwiches.   It is likely that patient has protein calorie malnutrition as evidenced by hypoalbuminemia.   He has chronic heavy smoking, may need screening for occult malignancy.  His recent chest x-ray consistent with hyperinflated lungs suggestive of COPD without any acute changes.  He has 50+ pack years history of smoking.  - He is advised to continue levothyroxine  25 mg p.o. daily before breakfast.  -Patient is at risk of liver  disease, cardiomyopathy, CKD, malignancies, etc.    The patient was counseled on the dangers of tobacco use, and was advised to quit.  Reviewed strategies to maximize success, including removing cigarettes and smoking materials from environment.  - he is advised to maintain close follow up with Verdie Scull A, DO for primary care needs.   I spent  25  minutes in the care of the patient today including review of labs from Thyroid  Function, CMP, and other relevant labs ; imaging/biopsy records (current and previous including abstractions from other facilities); face-to-face time discussing  his lab results and symptoms, medications doses, his options of short and long term treatment based on the latest standards of care / guidelines;   and documenting the encounter.  Travis Weeks  participated in the discussions, expressed understanding, and voiced agreement with the above plans.  All questions were answered to his satisfaction. he is encouraged to contact clinic should he have any questions or concerns prior to his return visit.   Follow up plan: Return in about 9 weeks (around 06/07/2024) for F/U with Pre-visit Labs.  Travis Earl, MD [facility] [address] [place], [place] [address] Phone: [phone]  Fax: [phone]     04/05/2024, 2:08 PM  This note was partially dictated with voice recognition software. Similar sounding words can be transcribed inadequately or may not  be corrected upon review.

## 2024-04-05 NOTE — Telephone Encounter (Signed)
-----   Message from Brittany T sent at 04/05/2024  9:34 AM EST ----- Can you send pt's [facility] to [facility] for his ACTH  3 point, thank you

## 2024-04-06 ENCOUNTER — Other Ambulatory Visit (HOSPITAL_COMMUNITY): Payer: Self-pay | Admitting: "Endocrinology

## 2024-04-11 ENCOUNTER — Telehealth: Payer: Self-pay

## 2024-04-11 NOTE — Telephone Encounter (Signed)
 Auth Submission: NO AUTH NEEDED Site of care: Site of care: [facility] Payer:  [place] [employer]   Medication & CPT/J Code(s) submitted: Cosyntropin  [id number] Diagnosis Code:  Route of submission (phone, fax, portal): phone Phone # Fax # Auth type: Buy/Bill PB Units/visits requested: 1 dose Reference number:  Approval from: 04/11/24 to 05/11/24  \

## 2024-06-07 ENCOUNTER — Telehealth: Payer: Self-pay | Admitting: "Endocrinology

## 2024-06-07 NOTE — Telephone Encounter (Signed)
 This pt has not been scheduled for his ACTH  Stimulation. His appt with us  is 2/2. Please Advise?

## 2024-06-07 NOTE — Telephone Encounter (Signed)
 I'll call the [facility] tomorrow.

## 2024-06-08 NOTE — Telephone Encounter (Signed)
 Spoke with Travis Weeks from the [facility] she stated they had tried to contact pt on several occasions and had to leave a message with someone that stated they would relay the message to the pt. States they never received a return call from the pt.

## 2024-06-08 NOTE — Telephone Encounter (Signed)
 Spoke with patient, seems confused. Gave him the number to call and schedule his ACTH  Stimulation then call us  once that is done to follow up with Dr Lenis. [phone]

## 2024-06-13 ENCOUNTER — Ambulatory Visit: Admitting: "Endocrinology
